# Patient Record
Sex: Male | Born: 1995 | Race: White | Hispanic: Yes | Marital: Single | Smoking: Former smoker
Health system: Southern US, Community
[De-identification: ages and names within clinical notes are randomized; demographics above are authoritative.]

## PROBLEM LIST (undated history)

## (undated) ENCOUNTER — Ambulatory Visit (HOSPITAL_COMMUNITY): Discharge status: Absent without leave | Payer: 59

## (undated) ENCOUNTER — Emergency Department (HOSPITAL_BASED_OUTPATIENT_CLINIC_OR_DEPARTMENT_OTHER): Admission: EM | Payer: No Typology Code available for payment source | Source: Home / Self Care

## (undated) DIAGNOSIS — J9600 Acute respiratory failure, unspecified whether with hypoxia or hypercapnia: Secondary | ICD-10-CM

## (undated) HISTORY — PX: OTHER SURGICAL HISTORY: SHX169

## (undated) HISTORY — PX: KNEE ARTHROSCOPY: SUR90

---
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
## 2016-05-13 ENCOUNTER — Encounter (HOSPITAL_COMMUNITY): Payer: Self-pay | Admitting: Family Medicine

## 2016-05-13 ENCOUNTER — Ambulatory Visit (HOSPITAL_COMMUNITY)
Admission: EM | Admit: 2016-05-13 | Discharge: 2016-05-13 | Disposition: A | Discharge status: Home / Self Care | Payer: BLUE CROSS/BLUE SHIELD | Attending: Family Medicine | Admitting: Family Medicine

## 2016-05-13 DIAGNOSIS — K2900 Acute gastritis without bleeding: Secondary | ICD-10-CM

## 2016-05-13 MED ORDER — ONDANSETRON 8 MG PO TBDP: 8.0000 mg | ORAL_TABLET | Freq: Three times a day (TID) | ORAL | 0 refills | Status: DC | PRN

## 2016-05-13 MED ORDER — OMEPRAZOLE 20 MG PO CPDR: 20.0000 mg | DELAYED_RELEASE_CAPSULE | Freq: Every day | ORAL | 1 refills | Status: DC

## 2016-05-13 NOTE — Discharge Instructions (Signed)
You need to realize that alcohol is a solvent and will cause acute gastrointestinal bleeding. I recommend that you stop drinking a call completely for at least 2 weeks. If you feel that you cannot stop drinking, that suggests a different problem and you would need to see a counselor to help you through that situation.

## 2016-05-13 NOTE — ED Provider Notes (Addendum)
[facility]    CSN: [id number] Arrival date & time: 05/13/16  1410     History   Chief Complaint Chief Complaint  Patient presents with  . Abdominal Pain  . Nausea    HPI Roberto Sims is a 20 y.o. male.   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 Vomiting is usually in the morning. He says that at times he's had quite a bit of alcohol to drink (Saturday night he had 10 beers). He says he does not smoke or take any other medicine. He has not tried anything to alleviate the problem of the past week.  Patient denies chronic abdominal pain or gastrointestinal problems. He's had no diarrhea.  Patient says that on a couple occasions with him these had a little red streaking.  I spoke to the patient after writing the prescriptions. He can find an intermediate that he had been through rehabilitation and that he knows he has an alcohol problem. I offered to come up with rehabilitation services and strongly recommended that he stop drinking altogether. He declined my suggestion for rehabilitation and said he appreciated my suggestions.      History reviewed. No pertinent past medical history.  There are no active problems to display for this patient.   Past Surgical History:  Procedure Laterality Date  . boxers fracture Right   . great toe fracture Right 2008   Fx. growth plate  . KNEE ARTHROSCOPY Left        Home Medications    Prior to Admission medications   Not on File    Family History History reviewed. No pertinent family history.  Social History Social History  Substance Use Topics  . Smoking status: Never Smoker  . Smokeless tobacco: Never Used  . Alcohol use Yes     Comment: 0ccasional     Allergies   Review of patient's allergies indicates no known allergies.   Review of Systems Review of Systems  Constitutional: Negative.   HENT: Negative.   Respiratory: Negative.     Cardiovascular: Negative.   Gastrointestinal: Positive for nausea and vomiting.     Physical Exam Triage Vital Signs ED Triage Vitals [05/13/16 1522]  Enc Vitals Group     BP 126/82     Pulse Rate 60     Resp 16     Temp 98.5 F (36.9 C)     Temp Source Oral     SpO2 100 %     Weight      Height      Head Circumference      Peak Flow      Pain Score      Pain Loc      Pain Edu?      Excl. in GC?    No data found.   Updated Vital Signs BP 126/82 (BP Location: Left Arm)   Pulse 60   Temp 98.5 F (36.9 C) (Oral)   Resp 16   SpO2 100%   Physical Exam  Constitutional: He is oriented to person, place, and time. He appears well-developed and well-nourished.  HENT:  Head: Normocephalic.  Right Ear: External ear normal.  Left Ear: External ear normal.  Mouth/Throat: Oropharynx is clear and moist.  Eyes: Conjunctivae and EOM are normal. Pupils are equal, round, and reactive to light.  Neck: Normal range of motion. Neck supple.  Cardiovascular: Normal rate, regular rhythm and normal heart sounds.   Pulmonary/Chest: Effort  normal and breath sounds normal.  Abdominal: Soft. Bowel sounds are normal. He exhibits no mass. There is tenderness. There is no rebound and no guarding.  Mild right upper quadrant tenderness with deep palpation  Musculoskeletal: Normal range of motion.  Neurological: He is alert and oriented to person, place, and time.  Skin: Skin is warm and dry.  Nursing note and vitals reviewed.    UC Treatments / Results  Labs (all labs ordered are listed, but only abnormal results are displayed) Labs Reviewed - No data to display  EKG  EKG Interpretation None       Radiology No results found.  Procedures Procedures (including critical care time)  Medications Ordered in UC Medications - No data to display   Initial Impression / Assessment and Plan / UC Course  I have reviewed the triage vital signs and the nursing notes.  Pertinent labs &  imaging results that were available during my care of the patient were reviewed by me and considered in my medical decision making (see chart for details).  Clinical Course    Final Clinical Impressions(s) / UC Diagnoses   Final diagnoses:  Acute superficial gastritis without hemorrhage    New Prescriptions Current Discharge Medication List       Elvina SidleKurt Quindon Denker, MD 05/13/16 1546    Elvina SidleKurt Tyshon Fanning, MD 05/13/16 [phone]

## 2016-05-13 NOTE — ED Triage Notes (Signed)
Pt here for intermittent vomiting in the am with traces of blood. Complaining of pain in the upper abdomen more on the right side and burning.

## 2016-07-22 ENCOUNTER — Ambulatory Visit (HOSPITAL_COMMUNITY)
Admission: EM | Admit: 2016-07-22 | Discharge: 2016-07-22 | Discharge status: Absent without leave | Payer: BLUE CROSS/BLUE SHIELD

## 2018-03-14 ENCOUNTER — Ambulatory Visit (HOSPITAL_COMMUNITY)
Admission: EM | Admit: 2018-03-14 | Discharge: 2018-03-14 | Disposition: A | Discharge status: Home / Self Care | Payer: 59 | Attending: Family Medicine | Admitting: Family Medicine

## 2018-03-14 ENCOUNTER — Other Ambulatory Visit: Payer: Self-pay

## 2018-03-14 ENCOUNTER — Encounter (HOSPITAL_COMMUNITY): Payer: Self-pay | Admitting: *Deleted

## 2018-03-14 DIAGNOSIS — F410 Panic disorder [episodic paroxysmal anxiety] without agoraphobia: Secondary | ICD-10-CM

## 2018-03-14 DIAGNOSIS — R002 Palpitations: Secondary | ICD-10-CM

## 2018-03-14 NOTE — Discharge Instructions (Signed)
Please follow-up with behavioral health to help manage your anxiety/panic attacks.  As discussed, other things that can cause increase in panic attacks symptoms include alcohol, caffeine, smoking, drug use.  Please try to decrease the amount of alcohol, caffeine, smoking you are doing.  Please refrain from drug use.  Follow-up with behavioral health for further evaluation and management needed.  If having suicidal or homicidal ideation, please go to the emergency department for further evaluation.

## 2018-03-14 NOTE — ED Triage Notes (Signed)
Per pt he keeps having panic attacks, per pt this is his 5th one

## 2018-03-14 NOTE — ED Provider Notes (Signed)
[facility]    CSN: [id number] Arrival [id number] & time: 03/14/18  1044     History   Chief Complaint Chief Complaint  Patient presents with  . panic attacks    HPI Roberto Sims is a 22 y.o. male.   22 year old male with history of anxiety comes in for 2-week history of panic attacks.  States he has had 5 episodes of panic attacks.  States he gets anxious prior to social settings, last panic attack prior to these past 2 weeks was 3 months ago.  He denies any new stressors in life, changes.  States episodes include hyperventilation, tunnel vision, nausea, diaphoresis, palpitation.  Usually all the symptoms last for about 30 minutes, and gradually resolves within the next hour.  Depending on severity of panic attacks, he does feel slight dizziness, lightheadedness.  Usually the symptoms start prior to work or prior to social settings, had an episode of panic attacks this morning after waking up.  He denies suicidal ideation, homicidal ideation.  Denies chest pain.  Denies similar symptoms during exertion/exercising.  He is a current every other day THC smoker.  Daily alcohol use, states drinks 1 beer every day, and about 3 times a week, drinks more depending on social setting.  He drinks an energy drink every other day before gym.  States he had consistent benzo use about 5 years ago, recently, friend gave him an Ativan during 1 of the panic attacks.  Has cocaine use 3 months ago.  No other drug use.  He is currently is asymptomatic, states due to symptoms, was not able to go into work.     History reviewed. No pertinent past medical history.  There are no active problems to display for this patient.   Past Surgical History:  Procedure Laterality Date  . boxers fracture Right   . great toe fracture Right 2008   Fx. growth plate  . KNEE ARTHROSCOPY Left        Home Medications    Prior to Admission medications   Not on File    Family History History reviewed. No  pertinent family history.  Social History Social History   Tobacco Use  . Smoking status: Never Smoker  . Smokeless tobacco: Never Used  Substance Use Topics  . Alcohol use: Yes    Comment: 0ccasional  . Drug use: Not on file     Allergies   Patient has no known allergies.   Review of Systems Review of Systems  Reason unable to perform ROS: See HPI as above.     Physical Exam Triage Vital Signs ED Triage Vitals  Enc Vitals Group     BP 03/14/18 1216 112/72     Pulse Rate 03/14/18 1216 65     Resp --      Temp 03/14/18 1216 (!) 97.2 F (36.2 C)     Temp Source 03/14/18 1216 Oral     SpO2 03/14/18 1216 100 %     Weight --      Height --      Head Circumference --      Peak Flow --      Pain Score 03/14/18 1217 0     Pain Loc --      Pain Edu? --      Excl. in GC? --    No data found.  Updated Vital Signs BP 112/72 (BP Location: Left Arm)   Pulse 65   Temp (!) 97.2 F (36.2 C) (Oral)  SpO2 100%   Physical Exam  Constitutional: He is oriented to person, place, and time. He appears well-developed and well-nourished. No distress.  HENT:  Head: Normocephalic and atraumatic.  Eyes: Pupils are equal, round, and reactive to light. Conjunctivae are normal.  Cardiovascular: Normal rate, regular rhythm and normal heart sounds. Exam reveals no gallop and no friction rub.  No murmur heard. Pulmonary/Chest: Effort normal and breath sounds normal. No accessory muscle usage or stridor. No respiratory distress. He has no decreased breath sounds. He has no wheezes. He has no rhonchi. He has no rales.  Neurological: He is alert and oriented to person, place, and time. He is not disoriented. Coordination and gait normal. GCS eye subscore is 4. GCS verbal subscore is 5. GCS motor subscore is 6.  Skin: He is not diaphoretic.  Psychiatric: He has a normal mood and affect. His speech is normal and behavior is normal. Judgment and thought content normal. He is not actively  hallucinating. Cognition and memory are normal. He expresses no homicidal and no suicidal ideation. He expresses no suicidal plans and no homicidal plans. He is attentive.     UC Treatments / Results  Labs (all labs ordered are listed, but only abnormal results are displayed) Labs Reviewed - No data to display  EKG None  Radiology No results found.  Procedures Procedures (including critical care time)  Medications Ordered in UC Medications - No data to display  Initial Impression / Assessment and Plan / UC Course  I have reviewed the triage vital signs and the nursing notes.  Pertinent labs & imaging results that were available during my care of the patient were reviewed by me and considered in my medical decision making (see chart for details).    Patient currently asymptomatic.  Denies suicidal or homicidal ideation.  Will have patient decrease caffeine intake, smoking, alcohol use, drug use.  Follow-up with behavioral health for further evaluation and management needed.  Return precautions given.  Resources provided.  Patient expresses understanding and agrees to plan.  Final Clinical Impressions(s) / UC Diagnoses   Final diagnoses:  Panic attacks  Palpitations    ED Prescriptions    None        Belinda Fisher, PA-C 03/14/18 1314

## 2018-04-15 ENCOUNTER — Ambulatory Visit (INDEPENDENT_AMBULATORY_CARE_PROVIDER_SITE_OTHER): Payer: 59

## 2018-04-15 ENCOUNTER — Ambulatory Visit (HOSPITAL_COMMUNITY)
Admission: EM | Admit: 2018-04-15 | Discharge: 2018-04-15 | Disposition: A | Discharge status: Home / Self Care | Payer: 59 | Attending: Family Medicine | Admitting: Family Medicine

## 2018-04-15 ENCOUNTER — Encounter (HOSPITAL_COMMUNITY): Payer: Self-pay

## 2018-04-15 DIAGNOSIS — J181 Lobar pneumonia, unspecified organism: Secondary | ICD-10-CM

## 2018-04-15 DIAGNOSIS — J189 Pneumonia, unspecified organism: Secondary | ICD-10-CM

## 2018-04-15 MED ORDER — ACETAMINOPHEN 325 MG PO TABS
650.0000 mg | ORAL_TABLET | Freq: Once | ORAL | Status: AC
  Administered 2018-04-15: 650 mg via ORAL

## 2018-04-15 MED ORDER — ALBUTEROL SULFATE HFA 108 (90 BASE) MCG/ACT IN AERS: 1.0000 | INHALATION_SPRAY | Freq: Four times a day (QID) | RESPIRATORY_TRACT | 0 refills | Status: DC | PRN

## 2018-04-15 MED ORDER — ACETAMINOPHEN 325 MG PO TABS
ORAL_TABLET | ORAL | Status: AC
  Filled 2018-04-15: qty 2

## 2018-04-15 MED ORDER — AZITHROMYCIN 250 MG PO TABS: ORAL_TABLET | ORAL | 0 refills | Status: AC

## 2018-04-15 NOTE — ED Triage Notes (Signed)
Pt presents with persistent cough, chills, headache, generalized body aches and fever.

## 2018-04-15 NOTE — Discharge Instructions (Signed)
Push fluids to ensure adequate hydration and keep secretions thin.  Tylenol and/or ibuprofen as needed for pain or fevers.  May continue with over the counter treatments as needed.  Complete course of antibiotics.  Use of inhaler as needed.  Please follow up with your primary care provider for recheck in 3-4 weeks.  If any worsening of symptoms, increased shortness of breath , difficulty breathing, fevers, not improving in the next week or otherwise worsening please return to be seen or go to Er.  Decrease to quit smoking as this can worsen symptoms.

## 2018-04-15 NOTE — ED Provider Notes (Signed)
[facility]    CSN: [id number] Arrival date & time: 04/15/18  1114     History   Chief Complaint Chief Complaint  Patient presents with  . Cough  . Chills  . Generalized Body Aches    HPI Roberto Sims is a 22 y.o. male.   Roberto Sims presents with complaints of fever, productive cough and nasal drainage. States developed cough approximately 1 week ago but had started to improved but then over the past two days has worsened. Cough is becoming less and less productive. Tylenol last at 0900 PTA. Has also been taking dayquil and nyquil which have not been helping. There has been cough illness at work. No asthma history. Smokes marijuana daily. Feels shortness of breath  At times. Nausea. No vomiting or diarrhea. Without contributing medical history.      ROS per HPI.      History reviewed. No pertinent past medical history.  There are no active problems to display for this patient.   Past Surgical History:  Procedure Laterality Date  . boxers fracture Right   . great toe fracture Right 2008   Fx. growth plate  . KNEE ARTHROSCOPY Left        Home Medications    Prior to Admission medications   Medication Sig Start Date End Date Taking? Authorizing Provider  albuterol (PROVENTIL HFA;VENTOLIN HFA) 108 (90 Base) MCG/ACT inhaler Inhale 1-2 puffs into the lungs every 6 (six) hours as needed for wheezing or shortness of breath. 04/15/18   Georgetta Haber, NP  azithromycin (ZITHROMAX) 250 MG tablet Take 2 tablets (500 mg total) by mouth daily for 1 day, THEN 1 tablet (250 mg total) daily for 4 days. 04/15/18 04/20/18  Georgetta Haber, NP    Family History History reviewed. No pertinent family history.  Social History Social History   Tobacco Use  . Smoking status: Never Smoker  . Smokeless tobacco: Never Used  Substance Use Topics  . Alcohol use: Yes    Comment: 0ccasional  . Drug use: Not on file     Allergies   Patient has no known  allergies.   Review of Systems Review of Systems   Physical Exam Triage Vital Signs ED Triage Vitals [04/15/18 1233]  Enc Vitals Group     BP 101/75     Pulse Rate (!) 110     Resp 20     Temp (!) 102.4 F (39.1 C)     Temp Source Oral     SpO2 93 %     Weight      Height      Head Circumference      Peak Flow      Pain Score      Pain Loc      Pain Edu?      Excl. in GC?    No data found.  Updated Vital Signs BP 101/75 (BP Location: Left Arm)   Pulse (!) 110   Temp (!) 102.4 F (39.1 C) (Oral)   Resp 20   SpO2 93%   Visual Acuity Right Eye Distance:   Left Eye Distance:   Bilateral Distance:    Right Eye Near:   Left Eye Near:    Bilateral Near:     Physical Exam  Constitutional: He is oriented to person, place, and time. He appears well-developed and well-nourished.  HENT:  Head: Normocephalic and atraumatic.  Right Ear: Tympanic membrane, external ear and ear canal normal.  Left Ear: Tympanic  membrane, external ear and ear canal normal.  Nose: Nose normal. Right sinus exhibits no maxillary sinus tenderness and no frontal sinus tenderness. Left sinus exhibits no maxillary sinus tenderness and no frontal sinus tenderness.  Mouth/Throat: Uvula is midline, oropharynx is clear and moist and mucous membranes are normal.  Eyes: Pupils are equal, round, and reactive to light. Conjunctivae are normal.  Neck: Normal range of motion.  Cardiovascular: Regular rhythm. Tachycardia present.  Pulmonary/Chest: Effort normal. He has decreased breath sounds in the right lower field and the left lower field.  Strong congested cough noted   Lymphadenopathy:    He has no cervical adenopathy.  Neurological: He is alert and oriented to person, place, and time.  Skin: Skin is warm. He is diaphoretic.  Vitals reviewed.    UC Treatments / Results  Labs (all labs ordered are listed, but only abnormal results are displayed) Labs Reviewed - No data to  display  EKG None  Radiology Dg Chest 2 View  Result Date: 04/15/2018 CLINICAL DATA:  Cough over the last week, worsening recently. Productive. Fever. EXAM: CHEST - 2 VIEW COMPARISON:  None. FINDINGS: Patchy bronchopneumonia in the right perihilar region and mid lung. No dense consolidation or lobar collapse. The remainder the chest appears clear. No effusions. Heart and mediastinal shadows are normal. IMPRESSION: Patchy bronchopneumonia in the right mid lung and perihilar region. Electronically Signed   By: Paulina Fusi M.D.   On: 04/15/2018 13:38    Procedures Procedures (including critical care time)  Medications Ordered in UC Medications  acetaminophen (TYLENOL) tablet 650 mg (650 mg Oral Given 04/15/18 1240)    Initial Impression / Assessment and Plan / UC Course  I have reviewed the triage vital signs and the nursing notes.  Pertinent labs & imaging results that were available during my care of the patient were reviewed by me and considered in my medical decision making (see chart for details).     Febrile with tachycardia. Cough. Chest xray, history and physical consistent with CAP. Azithromycin provided. Tylenol provided in clinic today. Inhaler prn. Return precautions provided. If symptoms worsen or do not improve in the next week to return to be seen or to follow up with PCP.  Patient verbalized understanding and agreeable to plan.   Final Clinical Impressions(s) / UC Diagnoses   Final diagnoses:  Community acquired pneumonia of right middle lobe of lung (HCC)     Discharge Instructions     Push fluids to ensure adequate hydration and keep secretions thin.  Tylenol and/or ibuprofen as needed for pain or fevers.  May continue with over the counter treatments as needed.  Complete course of antibiotics.  Use of inhaler as needed.  Please follow up with your primary care provider for recheck in 3-4 weeks.  If any worsening of symptoms, increased shortness of breath ,  difficulty breathing, fevers, not improving in the next week or otherwise worsening please return to be seen or go to Er.  Decrease to quit smoking as this can worsen symptoms.    ED Prescriptions    Medication Sig Dispense Auth. Provider   azithromycin (ZITHROMAX) 250 MG tablet Take 2 tablets (500 mg total) by mouth daily for 1 day, THEN 1 tablet (250 mg total) daily for 4 days. 6 tablet Linus Mako B, NP   albuterol (PROVENTIL HFA;VENTOLIN HFA) 108 (90 Base) MCG/ACT inhaler Inhale 1-2 puffs into the lungs every 6 (six) hours as needed for wheezing or shortness of breath. 1 Inhaler [place],  Barron Alvine, NP     Controlled Substance Prescriptions [place] Controlled Substance Registry consulted? Not Applicable   Georgetta Haber, NP 04/15/18 1351

## 2018-05-05 ENCOUNTER — Inpatient Hospital Stay (HOSPITAL_COMMUNITY)
Admission: EM | Admit: 2018-05-05 | Discharge: 2018-05-09 | DRG: 917 | Disposition: A | Discharge status: Home / Self Care | Payer: No Typology Code available for payment source | Attending: Internal Medicine | Admitting: Internal Medicine

## 2018-05-05 ENCOUNTER — Encounter (HOSPITAL_COMMUNITY): Payer: Self-pay | Admitting: *Deleted

## 2018-05-05 ENCOUNTER — Emergency Department (HOSPITAL_COMMUNITY): Payer: No Typology Code available for payment source

## 2018-05-05 DIAGNOSIS — T510X1A Toxic effect of ethanol, accidental (unintentional), initial encounter: Secondary | ICD-10-CM | POA: Diagnosis present

## 2018-05-05 DIAGNOSIS — Z23 Encounter for immunization: Secondary | ICD-10-CM

## 2018-05-05 DIAGNOSIS — E876 Hypokalemia: Secondary | ICD-10-CM | POA: Diagnosis present

## 2018-05-05 DIAGNOSIS — J9602 Acute respiratory failure with hypercapnia: Secondary | ICD-10-CM | POA: Diagnosis present

## 2018-05-05 DIAGNOSIS — E872 Acidosis: Secondary | ICD-10-CM | POA: Diagnosis present

## 2018-05-05 DIAGNOSIS — K7200 Acute and subacute hepatic failure without coma: Secondary | ICD-10-CM | POA: Diagnosis present

## 2018-05-05 DIAGNOSIS — G4700 Insomnia, unspecified: Secondary | ICD-10-CM | POA: Diagnosis present

## 2018-05-05 DIAGNOSIS — Y92003 Bedroom of unspecified non-institutional (private) residence as the place of occurrence of the external cause: Secondary | ICD-10-CM

## 2018-05-05 DIAGNOSIS — Y902 Blood alcohol level of 40-59 mg/100 ml: Secondary | ICD-10-CM | POA: Diagnosis present

## 2018-05-05 DIAGNOSIS — T424X2A Poisoning by benzodiazepines, intentional self-harm, initial encounter: Secondary | ICD-10-CM | POA: Diagnosis not present

## 2018-05-05 DIAGNOSIS — F329 Major depressive disorder, single episode, unspecified: Secondary | ICD-10-CM | POA: Diagnosis present

## 2018-05-05 DIAGNOSIS — F4010 Social phobia, unspecified: Secondary | ICD-10-CM | POA: Diagnosis present

## 2018-05-05 DIAGNOSIS — R4182 Altered mental status, unspecified: Secondary | ICD-10-CM | POA: Diagnosis present

## 2018-05-05 DIAGNOSIS — Z79899 Other long term (current) drug therapy: Secondary | ICD-10-CM | POA: Diagnosis not present

## 2018-05-05 DIAGNOSIS — N390 Urinary tract infection, site not specified: Secondary | ICD-10-CM | POA: Diagnosis not present

## 2018-05-05 DIAGNOSIS — J9600 Acute respiratory failure, unspecified whether with hypoxia or hypercapnia: Secondary | ICD-10-CM

## 2018-05-05 DIAGNOSIS — F410 Panic disorder [episodic paroxysmal anxiety] without agoraphobia: Secondary | ICD-10-CM | POA: Diagnosis present

## 2018-05-05 DIAGNOSIS — Z915 Personal history of self-harm: Secondary | ICD-10-CM | POA: Diagnosis not present

## 2018-05-05 DIAGNOSIS — T1491XA Suicide attempt, initial encounter: Secondary | ICD-10-CM | POA: Diagnosis not present

## 2018-05-05 DIAGNOSIS — R579 Shock, unspecified: Secondary | ICD-10-CM | POA: Diagnosis present

## 2018-05-05 DIAGNOSIS — F1994 Other psychoactive substance use, unspecified with psychoactive substance-induced mood disorder: Secondary | ICD-10-CM

## 2018-05-05 DIAGNOSIS — T405X1A Poisoning by cocaine, accidental (unintentional), initial encounter: Secondary | ICD-10-CM | POA: Diagnosis present

## 2018-05-05 DIAGNOSIS — T50901A Poisoning by unspecified drugs, medicaments and biological substances, accidental (unintentional), initial encounter: Secondary | ICD-10-CM | POA: Diagnosis present

## 2018-05-05 DIAGNOSIS — F1010 Alcohol abuse, uncomplicated: Secondary | ICD-10-CM | POA: Diagnosis not present

## 2018-05-05 DIAGNOSIS — J9690 Respiratory failure, unspecified, unspecified whether with hypoxia or hypercapnia: Secondary | ICD-10-CM | POA: Insufficient documentation

## 2018-05-05 DIAGNOSIS — F419 Anxiety disorder, unspecified: Secondary | ICD-10-CM | POA: Diagnosis present

## 2018-05-05 DIAGNOSIS — J690 Pneumonitis due to inhalation of food and vomit: Secondary | ICD-10-CM | POA: Diagnosis present

## 2018-05-05 DIAGNOSIS — Z113 Encounter for screening for infections with a predominantly sexual mode of transmission: Secondary | ICD-10-CM | POA: Diagnosis present

## 2018-05-05 DIAGNOSIS — N179 Acute kidney failure, unspecified: Secondary | ICD-10-CM | POA: Diagnosis present

## 2018-05-05 DIAGNOSIS — T50904S Poisoning by unspecified drugs, medicaments and biological substances, undetermined, sequela: Secondary | ICD-10-CM | POA: Diagnosis not present

## 2018-05-05 DIAGNOSIS — J9601 Acute respiratory failure with hypoxia: Secondary | ICD-10-CM | POA: Diagnosis present

## 2018-05-05 DIAGNOSIS — G9341 Metabolic encephalopathy: Secondary | ICD-10-CM | POA: Diagnosis present

## 2018-05-05 DIAGNOSIS — R7401 Elevation of levels of liver transaminase levels: Secondary | ICD-10-CM | POA: Diagnosis present

## 2018-05-05 DIAGNOSIS — T424X1A Poisoning by benzodiazepines, accidental (unintentional), initial encounter: Secondary | ICD-10-CM | POA: Diagnosis present

## 2018-05-05 DIAGNOSIS — T17908A Unspecified foreign body in respiratory tract, part unspecified causing other injury, initial encounter: Secondary | ICD-10-CM | POA: Diagnosis not present

## 2018-05-05 DIAGNOSIS — Z811 Family history of alcohol abuse and dependence: Secondary | ICD-10-CM

## 2018-05-05 DIAGNOSIS — R740 Nonspecific elevation of levels of transaminase and lactic acid dehydrogenase [LDH]: Secondary | ICD-10-CM

## 2018-05-05 DIAGNOSIS — T17908S Unspecified foreign body in respiratory tract, part unspecified causing other injury, sequela: Secondary | ICD-10-CM | POA: Diagnosis not present

## 2018-05-05 HISTORY — DX: Acute respiratory failure, unspecified whether with hypoxia or hypercapnia: J96.00

## 2018-05-05 LAB — URINALYSIS, ROUTINE W REFLEX MICROSCOPIC
BILIRUBIN URINE: NEGATIVE
Glucose, UA: 50 mg/dL — AB
KETONES UR: NEGATIVE mg/dL
LEUKOCYTES UA: NEGATIVE
Nitrite: NEGATIVE
PH: 6 (ref 5.0–8.0)
PROTEIN: 30 mg/dL — AB
Specific Gravity, Urine: 1.006 (ref 1.005–1.030)

## 2018-05-05 LAB — CBC WITH DIFFERENTIAL/PLATELET
ABS IMMATURE GRANULOCYTES: 0.7 10*3/uL — AB (ref 0.00–0.07)
BASOS PCT: 0 %
Basophils Absolute: 0.1 10*3/uL (ref 0.0–0.1)
EOS ABS: 0.2 10*3/uL (ref 0.0–0.5)
Eosinophils Relative: 1 %
HCT: 51.1 % (ref 39.0–52.0)
Hemoglobin: 15.8 g/dL (ref 13.0–17.0)
Immature Granulocytes: 3 %
Lymphocytes Relative: 16 %
Lymphs Abs: 3.3 10*3/uL (ref 0.7–4.0)
MCH: 29.3 pg (ref 26.0–34.0)
MCHC: 30.9 g/dL (ref 30.0–36.0)
MCV: 94.6 fL (ref 80.0–100.0)
MONOS PCT: 2 %
Monocytes Absolute: 0.4 10*3/uL (ref 0.1–1.0)
NEUTROS ABS: 16 10*3/uL — AB (ref 1.7–7.7)
Neutrophils Relative %: 78 %
PLATELETS: 332 10*3/uL (ref 150–400)
RBC: 5.4 MIL/uL (ref 4.22–5.81)
RDW: 12 % (ref 11.5–15.5)
WBC: 20.8 10*3/uL — AB (ref 4.0–10.5)
nRBC: 0 % (ref 0.0–0.2)

## 2018-05-05 LAB — BLOOD GAS, ARTERIAL
Acid-base deficit: 4.7 mmol/L — ABNORMAL HIGH (ref 0.0–2.0)
BICARBONATE: 20.6 mmol/L (ref 20.0–28.0)
Drawn by: 244861
FIO2: 40
O2 SAT: 93.1 %
PEEP: 5 cmH2O
Patient temperature: 98.6
RATE: 14 resp/min
VT: 630 mL
pCO2 arterial: 42.5 mmHg (ref 32.0–48.0)
pH, Arterial: 7.306 — ABNORMAL LOW (ref 7.350–7.450)
pO2, Arterial: 71.9 mmHg — ABNORMAL LOW (ref 83.0–108.0)

## 2018-05-05 LAB — COMPREHENSIVE METABOLIC PANEL
ALBUMIN: 4.3 g/dL (ref 3.5–5.0)
ALK PHOS: 90 U/L (ref 38–126)
ALT: 1012 U/L — ABNORMAL HIGH (ref 0–44)
ALT: 3387 U/L — AB (ref 0–44)
ANION GAP: 19 — AB (ref 5–15)
AST: 541 U/L — ABNORMAL HIGH (ref 15–41)
AST: 2401 U/L — AB (ref 15–41)
Albumin: 3.6 g/dL (ref 3.5–5.0)
Alkaline Phosphatase: 71 U/L (ref 38–126)
Anion gap: 9 (ref 5–15)
BILIRUBIN TOTAL: 1.2 mg/dL (ref 0.3–1.2)
BUN: 12 mg/dL (ref 6–20)
BUN: 12 mg/dL (ref 6–20)
CALCIUM: 8.8 mg/dL — AB (ref 8.9–10.3)
CHLORIDE: 110 mmol/L (ref 98–111)
CO2: 17 mmol/L — ABNORMAL LOW (ref 22–32)
CO2: 22 mmol/L (ref 22–32)
CREATININE: 1.44 mg/dL — AB (ref 0.61–1.24)
Calcium: 7.8 mg/dL — ABNORMAL LOW (ref 8.9–10.3)
Chloride: 104 mmol/L (ref 98–111)
Creatinine, Ser: 2.12 mg/dL — ABNORMAL HIGH (ref 0.61–1.24)
GFR calc Af Amer: 49 mL/min — ABNORMAL LOW (ref >60)
GFR calc Af Amer: >60 mL/min (ref >60)
GFR calc non Af Amer: >60 mL/min (ref >60)
GFR, EST NON AFRICAN AMERICAN: 42 mL/min — AB (ref >60)
GLUCOSE: 173 mg/dL — AB (ref 70–99)
Glucose, Bld: 111 mg/dL — ABNORMAL HIGH (ref 70–99)
Potassium: 4.3 mmol/L (ref 3.5–5.1)
Potassium: 3.8 mmol/L (ref 3.5–5.1)
SODIUM: 141 mmol/L (ref 135–145)
Sodium: 140 mmol/L (ref 135–145)
TOTAL PROTEIN: 7.6 g/dL (ref 6.5–8.1)
TOTAL PROTEIN: 6.3 g/dL — AB (ref 6.5–8.1)
Total Bilirubin: 1.3 mg/dL — ABNORMAL HIGH (ref 0.3–1.2)

## 2018-05-05 LAB — I-STAT CG4 LACTIC ACID, ED: Lactic Acid, Venous: 8.98 mmol/L (ref 0.5–1.9)

## 2018-05-05 LAB — CBC
HEMATOCRIT: 44.1 % (ref 39.0–52.0)
HEMOGLOBIN: 14.6 g/dL (ref 13.0–17.0)
MCH: 29.9 pg (ref 26.0–34.0)
MCHC: 33.1 g/dL (ref 30.0–36.0)
MCV: 90.2 fL (ref 80.0–100.0)
Platelets: 252 10*3/uL (ref 150–400)
RBC: 4.89 MIL/uL (ref 4.22–5.81)
RDW: 12 % (ref 11.5–15.5)
WBC: 9.8 10*3/uL (ref 4.0–10.5)
nRBC: 0 % (ref 0.0–0.2)

## 2018-05-05 LAB — PROTIME-INR
INR: 1.18
INR: 1.28
PROTHROMBIN TIME: 14.9 s (ref 11.4–15.2)
PROTHROMBIN TIME: 15.9 s — AB (ref 11.4–15.2)

## 2018-05-05 LAB — I-STAT CHEM 8, ED
BUN: 17 mg/dL (ref 6–20)
CHLORIDE: 104 mmol/L (ref 98–111)
CREATININE: 2.3 mg/dL — AB (ref 0.61–1.24)
Calcium, Ion: 1.04 mmol/L — ABNORMAL LOW (ref 1.15–1.40)
Glucose, Bld: 172 mg/dL — ABNORMAL HIGH (ref 70–99)
HEMATOCRIT: 49 % (ref 39.0–52.0)
HEMOGLOBIN: 16.7 g/dL (ref 13.0–17.0)
POTASSIUM: 4.3 mmol/L (ref 3.5–5.1)
Sodium: 141 mmol/L (ref 135–145)
TCO2: 23 mmol/L (ref 22–32)

## 2018-05-05 LAB — CORTISOL: CORTISOL PLASMA: 13.3 ug/dL

## 2018-05-05 LAB — RAPID URINE DRUG SCREEN, HOSP PERFORMED
Amphetamines: NOT DETECTED
Amphetamines: NOT DETECTED
BARBITURATES: NOT DETECTED
BENZODIAZEPINES: POSITIVE — AB
Barbiturates: NOT DETECTED
Benzodiazepines: POSITIVE — AB
COCAINE: POSITIVE — AB
Cocaine: POSITIVE — AB
OPIATES: NOT DETECTED
OPIATES: NOT DETECTED
Tetrahydrocannabinol: POSITIVE — AB
Tetrahydrocannabinol: POSITIVE — AB

## 2018-05-05 LAB — TYPE AND SCREEN
ABO/RH(D): A POS
Antibody Screen: NEGATIVE

## 2018-05-05 LAB — PROCALCITONIN: Procalcitonin: 1.61 ng/mL

## 2018-05-05 LAB — I-STAT TROPONIN, ED: TROPONIN I, POC: 0.05 ng/mL (ref 0.00–0.08)

## 2018-05-05 LAB — I-STAT ARTERIAL BLOOD GAS, ED
Acid-base deficit: 7 mmol/L — ABNORMAL HIGH (ref 0.0–2.0)
Bicarbonate: 20.8 mmol/L (ref 20.0–28.0)
O2 Saturation: 98 %
PH ART: 7.268 — AB (ref 7.350–7.450)
TCO2: 22 mmol/L (ref 22–32)
pCO2 arterial: 44.7 mmHg (ref 32.0–48.0)
pO2, Arterial: 124 mmHg — ABNORMAL HIGH (ref 83.0–108.0)

## 2018-05-05 LAB — STREP PNEUMONIAE URINARY ANTIGEN: Strep Pneumo Urinary Antigen: NEGATIVE

## 2018-05-05 LAB — APTT: aPTT: 36 seconds (ref 24–36)

## 2018-05-05 LAB — LIPASE, BLOOD: LIPASE: 29 U/L (ref 11–51)

## 2018-05-05 LAB — GLUCOSE, CAPILLARY
GLUCOSE-CAPILLARY: 58 mg/dL — AB (ref 70–99)
GLUCOSE-CAPILLARY: 58 mg/dL — AB (ref 70–99)
GLUCOSE-CAPILLARY: 127 mg/dL — AB (ref 70–99)
Glucose-Capillary: 78 mg/dL (ref 70–99)

## 2018-05-05 LAB — PHOSPHORUS: Phosphorus: 3.7 mg/dL (ref 2.5–4.6)

## 2018-05-05 LAB — AMYLASE: Amylase: 1008 U/L — ABNORMAL HIGH (ref 28–100)

## 2018-05-05 LAB — TRIGLYCERIDES: Triglycerides: 201 mg/dL — ABNORMAL HIGH (ref <150)

## 2018-05-05 LAB — MAGNESIUM: MAGNESIUM: 2 mg/dL (ref 1.7–2.4)

## 2018-05-05 LAB — MRSA PCR SCREENING: MRSA by PCR: NEGATIVE

## 2018-05-05 LAB — CBG MONITORING, ED: Glucose-Capillary: 173 mg/dL — ABNORMAL HIGH (ref 70–99)

## 2018-05-05 LAB — ETHANOL: Alcohol, Ethyl (B): 50 mg/dL — ABNORMAL HIGH (ref <10)

## 2018-05-05 LAB — LACTIC ACID, PLASMA: LACTIC ACID, VENOUS: 2.7 mmol/L — AB (ref 0.5–1.9)

## 2018-05-05 LAB — CK
CK TOTAL: 153 U/L (ref 49–397)
CK TOTAL: 179 U/L (ref 49–397)

## 2018-05-05 MED ORDER — HEPARIN SODIUM (PORCINE) 5000 UNIT/ML IJ SOLN
5000.0000 [IU] | Freq: Three times a day (TID) | INTRAMUSCULAR | Status: DC
  Administered 2018-05-05 – 2018-05-08 (×9): 5000 [IU] via SUBCUTANEOUS
  Filled 2018-05-05 (×9): qty 1

## 2018-05-05 MED ORDER — SODIUM CHLORIDE 0.9 % IV SOLN
INTRAVENOUS | Status: DC
  Administered 2018-05-05 – 2018-05-07 (×3): via INTRAVENOUS

## 2018-05-05 MED ORDER — DEXTROSE 50 % IV SOLN
25.0000 mL | Freq: Once | INTRAVENOUS | Status: AC
  Administered 2018-05-05: 25 mL via INTRAVENOUS

## 2018-05-05 MED ORDER — SODIUM CHLORIDE 0.9 % IV BOLUS
1000.0000 mL | Freq: Once | INTRAVENOUS | Status: AC
  Administered 2018-05-05: 1000 mL via INTRAVENOUS

## 2018-05-05 MED ORDER — FAMOTIDINE IN NACL 20-0.9 MG/50ML-% IV SOLN: 20.0000 mg | Freq: Two times a day (BID) | INTRAVENOUS | Status: DC

## 2018-05-05 MED ORDER — PROPOFOL 1000 MG/100ML IV EMUL
INTRAVENOUS | Status: AC
  Filled 2018-05-05: qty 100

## 2018-05-05 MED ORDER — SODIUM CHLORIDE 0.9 % IV SOLN
1.5000 g | Freq: Four times a day (QID) | INTRAVENOUS | Status: DC
  Administered 2018-05-05 – 2018-05-07 (×7): 1.5 g via INTRAVENOUS
  Filled 2018-05-05 (×9): qty 1.5

## 2018-05-05 MED ORDER — PROPOFOL 1000 MG/100ML IV EMUL
5.0000 ug/kg/min | Freq: Once | INTRAVENOUS | Status: AC
  Administered 2018-05-05: 30 ug/kg/min via INTRAVENOUS

## 2018-05-05 MED ORDER — ALBUTEROL SULFATE (2.5 MG/3ML) 0.083% IN NEBU: 2.5000 mg | INHALATION_SOLUTION | RESPIRATORY_TRACT | Status: DC | PRN

## 2018-05-05 MED ORDER — MORPHINE SULFATE (PF) 4 MG/ML IV SOLN: 4.0000 mg | Freq: Once | INTRAVENOUS | Status: DC

## 2018-05-05 MED ORDER — LORAZEPAM 2 MG/ML IJ SOLN: 2.0000 mg | Freq: Once | INTRAMUSCULAR | Status: DC

## 2018-05-05 MED ORDER — ETOMIDATE 2 MG/ML IV SOLN
INTRAVENOUS | Status: AC | PRN
  Administered 2018-05-05: 20 mg via INTRAVENOUS

## 2018-05-05 MED ORDER — SODIUM CHLORIDE 0.9 % IV SOLN: 250.0000 mL | INTRAVENOUS | Status: DC

## 2018-05-05 MED ORDER — PHENYLEPHRINE HCL-NACL 10-0.9 MG/250ML-% IV SOLN
25.0000 ug/min | INTRAVENOUS | Status: DC
  Filled 2018-05-05: qty 250

## 2018-05-05 MED ORDER — PROPOFOL 1000 MG/100ML IV EMUL
5.0000 ug/kg/min | INTRAVENOUS | Status: DC
  Administered 2018-05-05: 45 ug/kg/min via INTRAVENOUS
  Administered 2018-05-05: 30 ug/kg/min via INTRAVENOUS
  Administered 2018-05-05 – 2018-05-06 (×3): 70 ug/kg/min via INTRAVENOUS
  Filled 2018-05-05 (×6): qty 100

## 2018-05-05 MED ORDER — LORAZEPAM 2 MG/ML IJ SOLN
INTRAMUSCULAR | Status: AC
  Administered 2018-05-05: 2 mg
  Filled 2018-05-05: qty 1

## 2018-05-05 MED ORDER — NALOXONE HCL 2 MG/2ML IJ SOSY
2.0000 mg | PREFILLED_SYRINGE | Freq: Once | INTRAMUSCULAR | Status: AC
  Administered 2018-05-05: 2 mg via INTRAVENOUS

## 2018-05-05 MED ORDER — SODIUM CHLORIDE 0.9 % IV SOLN
600.0000 mg | Freq: Once | INTRAVENOUS | Status: AC
  Administered 2018-05-05: 600 mg via INTRAVENOUS
  Filled 2018-05-05: qty 6

## 2018-05-05 MED ORDER — FENTANYL CITRATE (PF) 100 MCG/2ML IJ SOLN
25.0000 ug | INTRAMUSCULAR | Status: DC | PRN
  Administered 2018-05-05: 100 ug via INTRAVENOUS
  Filled 2018-05-05: qty 2

## 2018-05-05 MED ORDER — FAMOTIDINE IN NACL 20-0.9 MG/50ML-% IV SOLN
20.0000 mg | Freq: Two times a day (BID) | INTRAVENOUS | Status: DC
  Administered 2018-05-05 – 2018-05-08 (×6): 20 mg via INTRAVENOUS
  Filled 2018-05-05 (×6): qty 50

## 2018-05-05 MED ORDER — LORAZEPAM 2 MG/ML IJ SOLN: 0.5000 mg | INTRAMUSCULAR | Status: DC | PRN

## 2018-05-05 MED ORDER — SUCCINYLCHOLINE CHLORIDE 20 MG/ML IJ SOLN
INTRAMUSCULAR | Status: AC | PRN
  Administered 2018-05-05: 120 mg via INTRAVENOUS

## 2018-05-05 MED ORDER — DEXTROSE 50 % IV SOLN
INTRAVENOUS | Status: AC
  Filled 2018-05-05: qty 50

## 2018-05-05 NOTE — ED Notes (Signed)
Positive I-stat Lactic acid results reported to Rosendo Gros, RN and Rodena Medin, MD

## 2018-05-05 NOTE — Progress Notes (Signed)
Pharmacy Antibiotic Note  Roberto Sims is a 22 y.o. male admitted on 05/05/2018 with aspiration pneumonia.  Pharmacy has been consulted for Unasyn dosing.  Plan: Unasyn 1.5 gm IV every 6 hours Will monitor Scr and adjust if needed  Height: 6' (182.9 cm) Weight: 197 lb 5 oz (89.5 kg) IBW/kg (Calculated) : 77.6  Temp (24hrs), Avg:96.5 F (35.8 C), Min:96.1 F (35.6 C), Max:98.8 F (37.1 C)  Recent Labs  Lab 05/05/18 0914 05/05/18 0931 05/05/18 0932 05/05/18 1331  WBC 20.8*  --   --  9.8  CREATININE 2.12* 2.30*  --  1.44*  LATICACIDVEN  --   --  8.98* 2.7*    Estimated Creatinine Clearance: 87.6 mL/min (A) (by C-G formula based on SCr of 1.44 mg/dL (H)).    No Known Allergies  Antimicrobials this admission: Unasyn 10/22 >>    Thank you for allowing pharmacy to be a part of this patient's care.  Jeanella Cara, PharmD, [facility] Clinical Pharmacist Please see AMION for all Pharmacists' Contact Phone Numbers 05/05/2018, 5:42 PM

## 2018-05-05 NOTE — H&P (Signed)
PULMONARY / CRITICAL CARE MEDICINE   NAME:  Roberto Sims, MRN:  [id number], DOB:  Nov 28, 1995, LOS: 0 ADMISSION DATE:  05/05/2018, REFERRING MD: Emergency department physician, CHIEF COMPLAINT: Vent dependent respiratory failure secondary to presumed substance abuse  BRIEF HISTORY:    22 year old with a history of substance abuse comes in with altered mental status. HISTORY OF PRESENT ILLNESS   22 year old male with a known history of panic attacks and anxiety and substance abuse who was last seen normal 05/03/2018 approximately 3:30 AM.  Stent to be obtunded at 7:00 required mechanical ventilatory support IE noninvasive bag mask valve transported to the emergency department. He was intubated by emergency department physician taken to the CT scan and treated with Ativan and propofol.  Pulmonary critical care was called to bedside and asked to admit. SIGNIFICANT PAST MEDICAL HISTORY   Anxiety Panic attacks Substance abuse  SIGNIFICANT EVENTS:  05/05/2018 admitted with altered mental status STUDIES:   05/03/2018 CT head with no acute abnormality CULTURES:    ANTIBIOTICS:    LINES/TUBES:   05/05/2018 endotracheal tube>> CONSULTANTS:   SUBJECTIVE:  22 year old with a history of anxiety and panic attacks and substance abuse admitted to due to being obtunded.  CONSTITUTIONAL: BP 97/62   Pulse 88   Temp (!) 96.1 F (35.6 C)   Resp 14   Ht 6' (1.829 m)   Wt 71 kg Comment: per RN  SpO2 100%   BMI 21.23 kg/m   No intake/output data recorded.     Vent Mode: PRVC FiO2 (%):  [60 %] 60 % Set Rate:  [14 bmp] 14 bmp Vt Set:  [630 mL] 630 mL PEEP:  [5 cmH20] 5 cmH20 Plateau Pressure:  [22 cmH20] 22 cmH20  PHYSICAL EXAM: General: Well-nourished well-developed male sedated on ventilator Neuro: Currently sedated with propofol.  Withdraws all extremities to noxious stimuli HEENT: Pupils equal reactive to light. Cardiovascular: Heart sounds regular regular rate and  rhythm Lungs: Clear to auscultation Abdomen: Soft nontender Musculoskeletal: Intact Skin: Warm and dry blood sugar  RESOLVED PROBLEM LIST   ASSESSMENT AND PLAN   Encephalopathic likely secondary to substance abuse possibility of alcohol withdrawal seizures. Full mechanical ventilatory support Drug screen CT of the head was negative Wean sedation as able  Vent dependent respiratory failure secondary to suspected drug overdose Wean FiO2 as tolerated Sedation to tolerate tube Monitor arterial blood gases With metabolic acidosis corrected assess for extubation Chest x-ray unremarkable  History of substance abuse History of panic attack Drug screen Substance abuse counseling once   Metabolic acidosis Fluid resuscitation Monitor lactic acid Check CK  Hypotension ,leukocytosis Check procalcitonin Fluid resuscitation Monitor lactic acid Panculture for completeness   SUMMARY OF TODAY'S PLAN:  22 year old with history of anxiety presents with altered mental status last seen okay at approximately 3:30 AM 05/03/2018.  Negative CT of the head.  Noted to have a metabolic acidosis with CK CK-MBs are pending.  Currently intubated and sedated but does withdrawal to all noxious stimuli.  Best Practice / Goals of Care / Disposition.   DVT PROPHYLAXIS: Heparin SUP PPI NUTRITION: N.p.o. MOBILITY: Bedrest GOALS OF CARE: Full code FAMILY DISCUSSIONS: 05/05/2018 no family at bedside DISPOSITION admit the ICU most likely be extubated within 24 hours.  LABS  Glucose Recent Labs  Lab 05/05/18 0913  GLUCAP 173*    BMET Recent Labs  Lab 05/05/18 0914 05/05/18 0931  NA 140 141  K 4.3 4.3  CL 104 104  CO2 17*  --   BUN 12  17  CREATININE 2.12* 2.30*  GLUCOSE 173* 172*    Liver Enzymes Recent Labs  Lab 05/05/18 0914  AST 541*  ALT 1,012*  ALKPHOS 90  BILITOT 1.2  ALBUMIN 4.3    Electrolytes Recent Labs  Lab 05/05/18 0914  CALCIUM 8.8*    CBC Recent Labs   Lab 05/05/18 0914 05/05/18 0931  WBC 20.8*  --   HGB 15.8 16.7  HCT 51.1 49.0  PLT 332  --     ABG Recent Labs  Lab 05/05/18 1044  PHART 7.268*  PCO2ART 44.7  PO2ART 124.0*    Coag's Recent Labs  Lab 05/05/18 0914  INR 1.18    Sepsis Markers Recent Labs  Lab 05/05/18 0932  LATICACIDVEN 8.98*    Cardiac Enzymes No results for input(s): TROPONINI, PROBNP in the last 168 hours.  PAST MEDICAL HISTORY :   He  has no past medical history on file.  PAST SURGICAL HISTORY:  He  has a past surgical history that includes great toe fracture (Right, 2008); boxers fracture (Right); and Knee arthroscopy (Left).  No Known Allergies  No current facility-administered medications on file prior to encounter.    Current Outpatient Medications on File Prior to Encounter  Medication Sig  . albuterol (PROVENTIL HFA;VENTOLIN HFA) 108 (90 Base) MCG/ACT inhaler Inhale 1-2 puffs into the lungs every 6 (six) hours as needed for wheezing or shortness of breath.    FAMILY HISTORY:   His family history is not on file.  SOCIAL HISTORY:  He  reports that he has never smoked. He has never used smokeless tobacco. He reports that he drinks alcohol.  REVIEW OF SYSTEMS:    05/05/2018 not currently available secondary to being attempted

## 2018-05-05 NOTE — ED Notes (Addendum)
Pt arrives to trauma bay with [facility] for unresponsiveness.  Pt was last seen by friends at 0700, friends report "partying" last night. Pt was given 2 mg narcan with ems and 2 mg on arrival to trauma bay. Pt has OPA in placed and receiving 02 by BVM - 15L. Pt is minimally responsive.

## 2018-05-05 NOTE — ED Provider Notes (Signed)
[facility] EMERGENCY DEPARTMENT Provider Note   CSN: [id number] Arrival date & time: 05/05/18  4098     History   Chief Complaint Chief Complaint  Patient presents with  . unresponsive    HPI Roberto Sims is a 22 y.o. male.  22 year old male presents via EMS for altered mental status.  Patient was found unresponsive in his bed by his girlfriend.  Patient reportedly was normal sometime early this morning around 3:57 AM.  He reportedly had alcohol last night.  Patient without history of seizure.  Patient without history of recent drug abuse.  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  EMS reports that on their arrival on scene the patient had minimal ventilations.  He was unresponsive.  Patient has oral and nasal airways in place.  He is being bagged by EMS.  He has had 2 mg of Narcan IV push enroute without improvement in his mental status.  The history is provided by medical records and the EMS personnel. The history is limited by the condition of the patient.  Altered Mental Status   This is a new problem. The current episode started 3 to 5 hours ago. The problem has not changed since onset.Risk factors include alcohol intake. His past medical history does not include seizures.    No past medical history on file.  There are no active problems to display for this patient.   Past Surgical History:  Procedure Laterality Date  . boxers fracture Right   . great toe fracture Right 2008   Fx. growth plate  . KNEE ARTHROSCOPY Left         Home Medications    Prior to Admission medications   Medication Sig Start Date End Date Taking? Authorizing Provider  albuterol (PROVENTIL HFA;VENTOLIN HFA) 108 (90 Base) MCG/ACT inhaler Inhale 1-2 puffs into the lungs every 6 (six) hours as needed for wheezing or shortness of breath. 04/15/18   Georgetta Haber, NP    Family History No family  history on file.  Social History Social History   Tobacco Use  . Smoking status: Never Smoker  . Smokeless tobacco: Never Used  Substance Use Topics  . Alcohol use: Yes    Comment: 0ccasional  . Drug use: Not on file     Allergies   Patient has no known allergies.   Review of Systems Review of Systems  Unable to perform ROS: Acuity of condition     Physical Exam Updated Vital Signs BP 105/76   Pulse 96   Resp 17   Ht 6' (1.829 m)   Wt 71 kg Comment: per RN  SpO2 100%   BMI 21.23 kg/m   Physical Exam  Constitutional: He appears well-developed and well-nourished. No distress.  HENT:  Head: Normocephalic and atraumatic.  Mouth/Throat: Oropharynx is clear and moist.  Eyes: Pupils are equal, round, and reactive to light. Conjunctivae and EOM are normal.  Neck: Normal range of motion. Neck supple.  Cardiovascular: Normal rate, regular rhythm and normal heart sounds.  Pulmonary/Chest: Effort normal and breath sounds normal. No respiratory distress.  Abdominal: Soft. He exhibits no distension. There is no tenderness.  Musculoskeletal: Normal range of motion. He exhibits no edema or deformity.  Neurological:  Unresponsive  Ventilated by BVM Withdraws all 4 ext to painful stimulation  Pupils equal bilaterally but sluggish  Skin: Skin is warm and dry.  Psychiatric: He has a normal mood  and affect.  Nursing note and vitals reviewed.    ED Treatments / Results  Labs (all labs ordered are listed, but only abnormal results are displayed) Labs Reviewed  ETHANOL - Abnormal; Notable for the following components:      Result Value   Alcohol, Ethyl (B) 50 (*)    All other components within normal limits  COMPREHENSIVE METABOLIC PANEL - Abnormal; Notable for the following components:   CO2 17 (*)    Glucose, Bld 173 (*)    Creatinine, Ser 2.12 (*)    Calcium 8.8 (*)    AST 541 (*)    ALT 1,012 (*)    GFR calc non Af Amer 42 (*)    GFR calc Af Amer 49 (*)     Anion gap 19 (*)    All other components within normal limits  CBC WITH DIFFERENTIAL/PLATELET - Abnormal; Notable for the following components:   WBC 20.8 (*)    Neutro Abs 16.0 (*)    Abs Immature Granulocytes 0.70 (*)    All other components within normal limits  CBG MONITORING, ED - Abnormal; Notable for the following components:   Glucose-Capillary 173 (*)    All other components within normal limits  I-STAT CHEM 8, ED - Abnormal; Notable for the following components:   Creatinine, Ser 2.30 (*)    Glucose, Bld 172 (*)    Calcium, Ion 1.04 (*)    All other components within normal limits  I-STAT CG4 LACTIC ACID, ED - Abnormal; Notable for the following components:   Lactic Acid, Venous 8.98 (*)    All other components within normal limits  PROTIME-INR  URINALYSIS, ROUTINE W REFLEX MICROSCOPIC  RAPID URINE DRUG SCREEN, HOSP PERFORMED  BLOOD GAS, ARTERIAL  CK  I-STAT TROPONIN, ED  TYPE AND SCREEN    EKG EKG Interpretation  Date/Time:  Tuesday May 05 2018 09:09:55 EDT Ventricular Rate:  108 PR Interval:    QRS Duration: 114 QT Interval:  375 QTC Calculation: 503 R Axis:   99 Text Interpretation:  Sinus tachycardia Borderline intraventricular conduction delay RSR' in V1 or V2, probably normal variant Prolonged QT interval Confirmed by Kristine Royal [phone]) on 05/05/2018 9:24:21 AM   Radiology Dg Chest 1 View  Result Date: 05/05/2018 CLINICAL DATA:  Intubation. EXAM: CHEST  1 VIEW COMPARISON:  04/15/2018 FINDINGS: Endotracheal tube tip at the clavicular heads. An orogastric tube and side-port reaches the stomach. Low volume chest with streaky left perihilar density. Likely normal heart size when accounting for accentuation by low volumes. IMPRESSION: 1. Unremarkable hardware positioning. 2. Low volume chest with left perihilar atelectasis. Electronically Signed   By: Marnee Spring M.D.   On: 05/05/2018 09:58    Procedures Procedure Name: Intubation Date/Time:  05/05/2018 10:50 AM Performed by: Wynetta Fines, MD Pre-anesthesia Checklist: Patient identified, Patient being monitored, Emergency Drugs available, Timeout performed and Suction available Oxygen Delivery Method: Ambu bag Preoxygenation: Pre-oxygenation with 100% oxygen Induction Type: Rapid sequence Ventilation: Mask ventilation without difficulty Laryngoscope Size: Glidescope Grade View: Grade I Tube size: 7.5 mm Number of attempts: 1 Placement Confirmation: ETT inserted through vocal cords under direct vision,  CO2 detector,  Breath sounds checked- equal and bilateral and Positive ETCO2 Tube secured with: ETT holder Comments: Well tolerated by patient.       (including critical care time) CRITICAL CARE Performed by: Wynetta Fines   Total critical care time: 45 minutes  Critical care time was exclusive of separately billable procedures and treating  other patients.  Critical care was necessary to treat or prevent imminent or life-threatening deterioration.  Critical care was time spent personally by me on the following activities: development of treatment plan with patient and/or surrogate as well as nursing, discussions with consultants, evaluation of patient's response to treatment, examination of patient, obtaining history from patient or surrogate, ordering and performing treatments and interventions, ordering and review of laboratory studies, ordering and review of radiographic studies, pulse oximetry and re-evaluation of patient's condition.   Medications Ordered in ED Medications  morphine 4 MG/ML injection 4 mg (0 mg Intravenous Hold 05/05/18 1019)  LORazepam (ATIVAN) injection 2 mg (0 mg Intravenous Hold 05/05/18 1023)  etomidate (AMIDATE) injection (20 mg Intravenous Given 05/05/18 0917)  succinylcholine (ANECTINE) injection (120 mg Intravenous Given 05/05/18 0917)  propofol (DIPRIVAN) 1000 MG/100ML infusion (60 mcg/kg/min  71 kg Intravenous Rate/Dose Change  05/05/18 1024)  naloxone [facility]) injection 2 mg (2 mg Intravenous Given 05/05/18 0915)  sodium chloride 0.9 % bolus 1,000 mL (0 mLs Intravenous Stopped 05/05/18 1024)  LORazepam (ATIVAN) 2 MG/ML injection (2 mg  Given 05/05/18 1026)     Initial Impression / Assessment and Plan / ED Course  I have reviewed the triage vital signs and the nursing notes.  Pertinent labs & imaging results that were available during my care of the patient were reviewed by me and considered in my medical decision making (see chart for details).     MDM  Screen complete  Patient is presenting for altered mental status.  Upon arrival patient is with significant respiratory depression.  He is unable to protect his airway.  He was intubated upon arrival.  History suggest that the patient's presentation may be secondary to either drug overdose or perhaps withdrawal seizure.  Critical care services are aware of the case and will evaluate the patient for admission.  Final Clinical Impressions(s) / ED Diagnoses   Final diagnoses:  Altered mental status, unspecified altered mental status type    ED Discharge Orders    None       Wynetta Fines, MD 05/05/18 1051

## 2018-05-05 NOTE — ED Notes (Signed)
Family at bedside. 

## 2018-05-05 NOTE — Progress Notes (Signed)
Assisted with intubation and placed pateint on vent. No noted respiratory issues at this time.

## 2018-05-05 NOTE — Progress Notes (Signed)
   05/05/18 1000  Clinical Encounter Type  Visited With Patient;Family  Visit Type Initial  Referral From Nurse  Spiritual Encounters  Spiritual Needs Emotional  Stress Factors  Patient Stress Factors None identified  Family Stress Factors Other (Comment)   Responded to page to ED Trauma C. PT was unresponsive. Family was arriving in consult room. Doctor shared with family the status of PT. I offered Spiritual care with ministry of presence and words of encouragement. Chaplain available as needed.  Chaplain Orest Dikes  [phone]

## 2018-05-05 NOTE — Progress Notes (Signed)
[facility] called to notified of temp and urine color

## 2018-05-06 ENCOUNTER — Inpatient Hospital Stay (HOSPITAL_COMMUNITY): Payer: No Typology Code available for payment source

## 2018-05-06 ENCOUNTER — Other Ambulatory Visit (HOSPITAL_COMMUNITY)
Admission: RE | Admit: 2018-05-06 | Discharge: 2018-05-06 | Disposition: A | Discharge status: Home / Self Care | Payer: No Typology Code available for payment source | Source: Ambulatory Visit | Attending: Pulmonary Disease | Admitting: Pulmonary Disease

## 2018-05-06 DIAGNOSIS — Z113 Encounter for screening for infections with a predominantly sexual mode of transmission: Secondary | ICD-10-CM | POA: Diagnosis not present

## 2018-05-06 DIAGNOSIS — T50904S Poisoning by unspecified drugs, medicaments and biological substances, undetermined, sequela: Secondary | ICD-10-CM

## 2018-05-06 DIAGNOSIS — N390 Urinary tract infection, site not specified: Secondary | ICD-10-CM

## 2018-05-06 LAB — BASIC METABOLIC PANEL
Anion gap: 8 (ref 5–15)
BUN: 15 mg/dL (ref 6–20)
CALCIUM: 8.1 mg/dL — AB (ref 8.9–10.3)
CO2: 21 mmol/L — ABNORMAL LOW (ref 22–32)
CREATININE: 1.25 mg/dL — AB (ref 0.61–1.24)
Chloride: 111 mmol/L (ref 98–111)
GFR calc Af Amer: >60 mL/min (ref >60)
GFR calc non Af Amer: >60 mL/min (ref >60)
Glucose, Bld: 84 mg/dL (ref 70–99)
Potassium: 3.3 mmol/L — ABNORMAL LOW (ref 3.5–5.1)
Sodium: 140 mmol/L (ref 135–145)

## 2018-05-06 LAB — HEPATIC FUNCTION PANEL
ALBUMIN: 3.1 g/dL — AB (ref 3.5–5.0)
ALT: 3063 U/L — ABNORMAL HIGH (ref 0–44)
AST: 2481 U/L — AB (ref 15–41)
Alkaline Phosphatase: 64 U/L (ref 38–126)
BILIRUBIN DIRECT: 0.4 mg/dL — AB (ref 0.0–0.2)
Indirect Bilirubin: 0.7 mg/dL (ref 0.3–0.9)
Total Bilirubin: 1.1 mg/dL (ref 0.3–1.2)
Total Protein: 5.5 g/dL — ABNORMAL LOW (ref 6.5–8.1)

## 2018-05-06 LAB — CBC
HCT: 39 % (ref 39.0–52.0)
Hemoglobin: 12.8 g/dL — ABNORMAL LOW (ref 13.0–17.0)
MCH: 29.7 pg (ref 26.0–34.0)
MCHC: 32.8 g/dL (ref 30.0–36.0)
MCV: 90.5 fL (ref 80.0–100.0)
PLATELETS: 200 10*3/uL (ref 150–400)
RBC: 4.31 MIL/uL (ref 4.22–5.81)
RDW: 12.3 % (ref 11.5–15.5)
WBC: 10.9 10*3/uL — AB (ref 4.0–10.5)
nRBC: 0 % (ref 0.0–0.2)

## 2018-05-06 LAB — BLOOD GAS, ARTERIAL
Acid-base deficit: 1.8 mmol/L (ref 0.0–2.0)
BICARBONATE: 21.9 mmol/L (ref 20.0–28.0)
Drawn by: 51133
FIO2: 0.4
LHR: 14 {breaths}/min
O2 Saturation: 98.8 %
PATIENT TEMPERATURE: 99.1
PCO2 ART: 33.9 mmHg (ref 32.0–48.0)
PEEP: 5 cmH2O
PO2 ART: 129 mmHg — AB (ref 83.0–108.0)
VT: 630 mL
pH, Arterial: 7.428 (ref 7.350–7.450)

## 2018-05-06 LAB — ABO/RH: ABO/RH(D): A POS

## 2018-05-06 LAB — URINE CULTURE: CULTURE: NO GROWTH

## 2018-05-06 LAB — ACETAMINOPHEN LEVEL: Acetaminophen (Tylenol), Serum: <10 ug/mL — ABNORMAL LOW (ref 10–30)

## 2018-05-06 LAB — HIV ANTIBODY (ROUTINE TESTING W REFLEX): HIV SCREEN 4TH GENERATION: NONREACTIVE

## 2018-05-06 MED ORDER — CHLORHEXIDINE GLUCONATE 0.12% ORAL RINSE (MEDLINE KIT)
15.0000 mL | Freq: Two times a day (BID) | OROMUCOSAL | Status: DC
  Administered 2018-05-06: 15 mL via OROMUCOSAL

## 2018-05-06 MED ORDER — LORAZEPAM 2 MG/ML IJ SOLN
0.5000 mg | INTRAMUSCULAR | Status: DC | PRN
  Administered 2018-05-07 – 2018-05-08 (×2): 1 mg via INTRAVENOUS
  Filled 2018-05-06 (×2): qty 1

## 2018-05-06 MED ORDER — ORAL CARE MOUTH RINSE
15.0000 mL | OROMUCOSAL | Status: DC
  Administered 2018-05-06: 15 mL via OROMUCOSAL

## 2018-05-06 MED ORDER — ORAL CARE MOUTH RINSE: 15.0000 mL | Freq: Two times a day (BID) | OROMUCOSAL | Status: DC

## 2018-05-06 MED ORDER — POTASSIUM CHLORIDE 20 MEQ/15ML (10%) PO SOLN
20.0000 meq | ORAL | Status: AC
  Administered 2018-05-06 (×2): 20 meq
  Filled 2018-05-06 (×2): qty 15

## 2018-05-06 MED ORDER — CHLORHEXIDINE GLUCONATE 0.12 % MT SOLN
15.0000 mL | Freq: Two times a day (BID) | OROMUCOSAL | Status: DC
  Administered 2018-05-07 – 2018-05-09 (×5): 15 mL via OROMUCOSAL
  Filled 2018-05-06 (×6): qty 15

## 2018-05-06 MED ORDER — FENTANYL CITRATE (PF) 100 MCG/2ML IJ SOLN: 12.5000 ug | INTRAMUSCULAR | Status: DC | PRN

## 2018-05-06 NOTE — Progress Notes (Signed)
NAME:  Roberto Sims, MRN:  [id number], DOB:  09-29-1995, LOS: 1 ADMISSION DATE:  05/05/2018, CONSULTATION DATE:  05/05/2018 REFERRING MD:  EDP, CHIEF COMPLAINT:  Found down   Brief History   22 y/o male with a history of substance abuse admitted for acute encephalopathy in the setting of unintentional drug overdose.  Past Medical History  Anxiety, substance abuse, panic attacks Significant Hospital Events   10/22 admission 10/23 fever  Consults: date of consult/date signed off & final recs:  none  Procedures (surgical and bedside):  10/22 ETT >   Significant Diagnostic Tests:  10/22 CT head > NAICP  Micro Data:  10/22 blood > neg 10/22 urine > neg  Antimicrobials:  10/22 unasyn >   Subjective:  Fever overnight Otherwise no acute events Passing SBT    Objective   Blood pressure 107/68, pulse 78, temperature 97.9 F (36.6 C), temperature source Bladder, resp. rate 14, height 6' (1.829 m), weight 91 kg, SpO2 98 %.    Vent Mode: PRVC FiO2 (%):  [40 %-60 %] 40 % Set Rate:  [14 bmp] 14 bmp Vt Set:  [630 mL] 630 mL PEEP:  [5 cmH20] 5 cmH20 Plateau Pressure:  [9 cmH20-32 cmH20] 17 cmH20   Intake/Output Summary (Last 24 hours) at 05/06/2018 0859 Last data filed at 05/06/2018 0800 Gross per 24 hour  Intake 3575.8 ml  Output 1355 ml  Net 2220.8 ml   Filed Weights   05/05/18 0915 05/05/18 1500 05/06/18 0500  Weight: 71 kg 89.5 kg 91 kg    Examination: General:  In bed on vent HENT: NCAT ETT in place PULM: CTA B, vent supported breathing CV: RRR, no mgr GI: BS+, soft, nontender MSK: normal bulk and tone Neuro: sedated on vent    Resolved Hospital Problem list     Assessment & Plan:  Acute respiratory failure with hypercapnea and hypoxemia due to drug overdose/inability to protect airway > SBT/WUA now > VAP prevention  Possible aspiration  > continue unasyn  UTI > unasyn > monitor urine culture > send urine chlamydia/gc  AKI> resolving  presumably shock/hypoxemia related >Monitor BMET and UOP >Replace electrolytes as needed  Hypokalemia > replace  Transaminitis > presumably shock/hypoxemic related to admission > repeat LFT now > check acetaminophen level now  Elvated amylase/normal lipase: undetermined significance > repeat   Disposition / Summary of Today's Plan 05/06/18   Read above     Diet: advance after extubation Pain/Anxiety/Delirium protocol (if indicated): PAD protocol now VAP protocol (if indicated): yes DVT prophylaxis: sub q hep GI prophylaxis: famotidine Hyperglycemia protocol: n/a Mobility: advance after extubation Code Status: full Family Communication: parents updated bedside  Labs   CBC: Recent Labs  Lab 05/05/18 0914 05/05/18 0931 05/05/18 1331 05/06/18 0351  WBC 20.8*  --  9.8 10.9*  NEUTROABS 16.0*  --   --   --   HGB 15.8 16.7 14.6 12.8*  HCT 51.1 49.0 44.1 39.0  MCV 94.6  --  90.2 90.5  PLT 332  --  252 200    Basic Metabolic Panel: Recent Labs  Lab 05/05/18 0914 05/05/18 0931 05/05/18 1331 05/06/18 0351  NA 140 141 141 140  K 4.3 4.3 3.8 3.3*  CL 104 104 110 111  CO2 17*  --  22 21*  GLUCOSE 173* 172* 111* 84  BUN 12 17 12 15   CREATININE 2.12* 2.30* 1.44* 1.25*  CALCIUM 8.8*  --  7.8* 8.1*  MG  --   --  2.0  --  PHOS  --   --  3.7  --    GFR: Estimated Creatinine Clearance: 100.9 mL/min (A) (by C-G formula based on SCr of 1.25 mg/dL (H)). Recent Labs  Lab 05/05/18 0914 05/05/18 0932 05/05/18 1331 05/06/18 0351  PROCALCITON  --   --  1.61  --   WBC 20.8*  --  9.8 10.9*  LATICACIDVEN  --  8.98* 2.7*  --     Liver Function Tests: Recent Labs  Lab 05/05/18 0914 05/05/18 1331  AST 541* 2,401*  ALT 1,012* 3,387*  ALKPHOS 90 71  BILITOT 1.2 1.3*  PROT 7.6 6.3*  ALBUMIN 4.3 3.6   Recent Labs  Lab 05/05/18 1331  LIPASE 29  AMYLASE 1,008*   No results for input(s): AMMONIA in the last 168 hours.  ABG    Component Value Date/Time   PHART  7.428 05/06/2018 0407   PCO2ART 33.9 05/06/2018 0407   PO2ART 129 (H) 05/06/2018 0407   HCO3 21.9 05/06/2018 0407   TCO2 22 05/05/2018 1044   ACIDBASEDEF 1.8 05/06/2018 0407   O2SAT 98.8 05/06/2018 0407     Coagulation Profile: Recent Labs  Lab 05/05/18 0914 05/05/18 1331  INR 1.18 1.28    Cardiac Enzymes: Recent Labs  Lab 05/05/18 1040 05/05/18 1331  CKTOTAL 153 179    HbA1C: No results found for: HGBA1C  CBG: Recent Labs  Lab 05/05/18 0913 05/05/18 1201 05/05/18 1206 05/05/18 1252 05/05/18 1552  GLUCAP 173* 58* 58* 127* 78       Critical care time: 37 minutes    Heber Hailey, MD [place] [facility] Pager: [phone] Cell: [phone] After 3pm or if no response, call [phone]

## 2018-05-06 NOTE — Procedures (Signed)
Extubation Procedure Note  Patient Details:   Name: Zameer Borman DOB: [birth year] MRN: [id number]   Airway Documentation:    Vent end date: 05/06/18 Vent end time: 1053   Evaluation  O2 sats: stable throughout Complications: No apparent complications Patient did tolerate procedure well. Bilateral Breath Sounds: Clear   Yes   Pt extubated to 3 L but increased to 5L based on spo2 post extubation. Positive cuff leak noted prior to extubation. Pt able to cough and speak and was encouraged to use Yankauer to clear secretions. No stridor noted. VS within normal limits.   Ermalinda Barrios M 05/06/2018, 11:01 AM

## 2018-05-06 NOTE — Progress Notes (Signed)
[facility] ADULT ICU REPLACEMENT PROTOCOL FOR AM LAB REPLACEMENT ONLY  The patient does apply for the [facility] Adult ICU Electrolyte Replacment Protocol based on the criteria listed below:   1. Is GFR >/= 40 ml/min? Yes.    Patient's GFR today is >60 2. Is urine output >/= 0.5 ml/kg/hr for the last 6 hours? Yes.   Patient's UOP is .6 ml/kg/hr 3. Is BUN < 60 mg/dL? Yes.    Patient's BUN today is 15 4. Abnormal electrolyte(s): K-3.3 5. Ordered repletion with: per protocol 6. If a panic level lab has been reported, has the CCM MD in charge been notified? Yes.  .   Physician:  Dr. Edwin Dada, Dixon Boos 05/06/2018 6:16 AM

## 2018-05-07 ENCOUNTER — Other Ambulatory Visit: Payer: Self-pay

## 2018-05-07 ENCOUNTER — Inpatient Hospital Stay (HOSPITAL_COMMUNITY): Payer: No Typology Code available for payment source

## 2018-05-07 ENCOUNTER — Encounter (HOSPITAL_COMMUNITY): Payer: Self-pay | Admitting: General Practice

## 2018-05-07 LAB — BASIC METABOLIC PANEL
ANION GAP: 9 (ref 5–15)
BUN: 6 mg/dL (ref 6–20)
CO2: 21 mmol/L — AB (ref 22–32)
Calcium: 8 mg/dL — ABNORMAL LOW (ref 8.9–10.3)
Chloride: 108 mmol/L (ref 98–111)
Creatinine, Ser: 1.08 mg/dL (ref 0.61–1.24)
GFR calc Af Amer: >60 mL/min (ref >60)
GLUCOSE: 115 mg/dL — AB (ref 70–99)
Potassium: 3 mmol/L — ABNORMAL LOW (ref 3.5–5.1)
Sodium: 138 mmol/L (ref 135–145)

## 2018-05-07 LAB — GC/CHLAMYDIA PROBE AMP (~~LOC~~) NOT AT ARMC
CHLAMYDIA, DNA PROBE: NEGATIVE
Neisseria Gonorrhea: NEGATIVE

## 2018-05-07 MED ORDER — AMOXICILLIN-POT CLAVULANATE 875-125 MG PO TABS
1.0000 | ORAL_TABLET | Freq: Two times a day (BID) | ORAL | Status: DC
  Administered 2018-05-07 – 2018-05-09 (×4): 1 via ORAL
  Filled 2018-05-07 (×5): qty 1

## 2018-05-07 MED ORDER — AMOXICILLIN-POT CLAVULANATE 875-125 MG PO TABS
1.0000 | ORAL_TABLET | Freq: Two times a day (BID) | ORAL | Status: DC
  Filled 2018-05-07: qty 1

## 2018-05-07 MED ORDER — POTASSIUM CHLORIDE CRYS ER 20 MEQ PO TBCR
40.0000 meq | EXTENDED_RELEASE_TABLET | Freq: Once | ORAL | Status: AC
  Administered 2018-05-07: 40 meq via ORAL
  Filled 2018-05-07: qty 2

## 2018-05-07 MED ORDER — INFLUENZA VAC SPLIT QUAD 0.5 ML IM SUSY
0.5000 mL | PREFILLED_SYRINGE | INTRAMUSCULAR | Status: AC
  Administered 2018-05-08: 0.5 mL via INTRAMUSCULAR

## 2018-05-07 NOTE — Progress Notes (Signed)
NAME:  Roberto Sims, MRN:  [id number], DOB:  1995/12/07, LOS: 2 ADMISSION DATE:  05/05/2018, CONSULTATION DATE:  05/05/2018 REFERRING MD:  EDP, CHIEF COMPLAINT:  Found down   Brief History   22 y/o male with a history of substance abuse admitted for acute encephalopathy in the setting of unintentional drug overdose.  Past Medical History  Anxiety, substance abuse, panic attacks Significant Hospital Events   10/22 admission 10/23 fever  Consults: date of consult/date signed off & final recs:  none  Procedures (surgical and bedside):  10/22 ETT > 10/23  Significant Diagnostic Tests:  10/22 CT head > NAICP  Micro Data:  10/22 blood > neg 10/22 urine > neg  Antimicrobials:  10/22 unasyn >   Subjective:   Extubated on 10/23 Feels OK Appetite not great   Objective   Blood pressure 123/71, pulse 100, temperature 98.9 F (37.2 C), temperature source Oral, resp. rate (!) 25, height 6' (1.829 m), weight 94.2 kg, SpO2 (!) 85 %.        Intake/Output Summary (Last 24 hours) at 05/07/2018 1008 Last data filed at 05/07/2018 0600 Gross per 24 hour  Intake 2886.57 ml  Output 2170 ml  Net 716.57 ml   Filed Weights   05/05/18 1500 05/06/18 0500 05/07/18 0500  Weight: 89.5 kg 91 kg 94.2 kg    Examination:  General:  Resting comfortably in bed HENT: NCAT OP clear PULM: CTA B, normal effort CV: RRR, no mgr GI: BS+, soft, nontender MSK: normal bulk and tone Neuro: awake, alert, no distress, MAEW     Resolved Hospital Problem list     Assessment & Plan:  Acute respiratory failure with hypercapnea and hypoxemia due to drug overdose/inability to protect airway> resolved > aspiration precautions  Possible aspiration  > change unasyn to augmentin > repeat CXR  Mild hypoxemia due to pneumonia > repeat CXR  UTI > change unasyn to augmentin > f/u urine gc/chlamydia  AKI> resolved Monitor BMET and UOP Replace electrolytes as needed  Hypokalemia >  Potassium  Transaminitis > essentially unchanged today > check acute hepatitis panel > check ultrasound of liver > repeat LFT in AM   Disposition / Summary of Today's Plan 05/07/18   Read above     Diet: advance after extubation Pain/Anxiety/Delirium protocol (if indicated): PAD protocol now VAP protocol (if indicated): yes DVT prophylaxis: sub q hep GI prophylaxis: famotidine Hyperglycemia protocol: n/a Mobility: advance after extubation Code Status: full Family Communication: parents updated bedside  Labs   CBC: Recent Labs  Lab 05/05/18 0914 05/05/18 0931 05/05/18 1331 05/06/18 0351  WBC 20.8*  --  9.8 10.9*  NEUTROABS 16.0*  --   --   --   HGB 15.8 16.7 14.6 12.8*  HCT 51.1 49.0 44.1 39.0  MCV 94.6  --  90.2 90.5  PLT 332  --  252 200    Basic Metabolic Panel: Recent Labs  Lab 05/05/18 0914 05/05/18 0931 05/05/18 1331 05/06/18 0351 05/07/18 0355  NA 140 141 141 140 138  K 4.3 4.3 3.8 3.3* 3.0*  CL 104 104 110 111 108  CO2 17*  --  22 21* 21*  GLUCOSE 173* 172* 111* 84 115*  BUN 12 17 12 15 6   CREATININE 2.12* 2.30* 1.44* 1.25* 1.08  CALCIUM 8.8*  --  7.8* 8.1* 8.0*  MG  --   --  2.0  --   --   PHOS  --   --  3.7  --   --  GFR: Estimated Creatinine Clearance: 126.7 mL/min (by C-G formula based on SCr of 1.08 mg/dL). Recent Labs  Lab 05/05/18 0914 05/05/18 0932 05/05/18 1331 05/06/18 0351  PROCALCITON  --   --  1.61  --   WBC 20.8*  --  9.8 10.9*  LATICACIDVEN  --  8.98* 2.7*  --     Liver Function Tests: Recent Labs  Lab 05/05/18 0914 05/05/18 1331 05/06/18 1010  AST 541* 2,401* 2,481*  ALT 1,012* 3,387* 3,063*  ALKPHOS 90 71 64  BILITOT 1.2 1.3* 1.1  PROT 7.6 6.3* 5.5*  ALBUMIN 4.3 3.6 3.1*   Recent Labs  Lab 05/05/18 1331  LIPASE 29  AMYLASE 1,008*   No results for input(s): AMMONIA in the last 168 hours.  ABG    Component Value Date/Time   PHART 7.428 05/06/2018 0407   PCO2ART 33.9 05/06/2018 0407   PO2ART 129  (H) 05/06/2018 0407   HCO3 21.9 05/06/2018 0407   TCO2 22 05/05/2018 1044   ACIDBASEDEF 1.8 05/06/2018 0407   O2SAT 98.8 05/06/2018 0407     Coagulation Profile: Recent Labs  Lab 05/05/18 0914 05/05/18 1331  INR 1.18 1.28    Cardiac Enzymes: Recent Labs  Lab 05/05/18 1040 05/05/18 1331  CKTOTAL 153 179    HbA1C: No results found for: HGBA1C  CBG: Recent Labs  Lab 05/05/18 0913 05/05/18 1201 05/05/18 1206 05/05/18 1252 05/05/18 1552  GLUCAP 173* 58* 58* 127* 78      Heber [employer], MD [place] [facility] Pager: [phone] Cell: [phone] After 3pm or if no response, call [phone]

## 2018-05-08 DIAGNOSIS — T17908S Unspecified foreign body in respiratory tract, part unspecified causing other injury, sequela: Secondary | ICD-10-CM

## 2018-05-08 DIAGNOSIS — G4700 Insomnia, unspecified: Secondary | ICD-10-CM

## 2018-05-08 DIAGNOSIS — R7401 Elevation of levels of liver transaminase levels: Secondary | ICD-10-CM | POA: Diagnosis present

## 2018-05-08 DIAGNOSIS — F1994 Other psychoactive substance use, unspecified with psychoactive substance-induced mood disorder: Secondary | ICD-10-CM

## 2018-05-08 DIAGNOSIS — T50901A Poisoning by unspecified drugs, medicaments and biological substances, accidental (unintentional), initial encounter: Secondary | ICD-10-CM | POA: Diagnosis present

## 2018-05-08 DIAGNOSIS — Y902 Blood alcohol level of 40-59 mg/100 ml: Secondary | ICD-10-CM

## 2018-05-08 DIAGNOSIS — F419 Anxiety disorder, unspecified: Secondary | ICD-10-CM

## 2018-05-08 DIAGNOSIS — J690 Pneumonitis due to inhalation of food and vomit: Secondary | ICD-10-CM | POA: Diagnosis present

## 2018-05-08 DIAGNOSIS — R4182 Altered mental status, unspecified: Secondary | ICD-10-CM

## 2018-05-08 DIAGNOSIS — T1491XA Suicide attempt, initial encounter: Secondary | ICD-10-CM

## 2018-05-08 DIAGNOSIS — R740 Nonspecific elevation of levels of transaminase and lactic acid dehydrogenase [LDH]: Secondary | ICD-10-CM

## 2018-05-08 DIAGNOSIS — F1010 Alcohol abuse, uncomplicated: Secondary | ICD-10-CM

## 2018-05-08 DIAGNOSIS — J9601 Acute respiratory failure with hypoxia: Secondary | ICD-10-CM

## 2018-05-08 DIAGNOSIS — J9602 Acute respiratory failure with hypercapnia: Secondary | ICD-10-CM

## 2018-05-08 DIAGNOSIS — T424X2A Poisoning by benzodiazepines, intentional self-harm, initial encounter: Secondary | ICD-10-CM

## 2018-05-08 LAB — CBC
HEMATOCRIT: 42.5 % (ref 39.0–52.0)
HEMOGLOBIN: 14.3 g/dL (ref 13.0–17.0)
MCH: 29.7 pg (ref 26.0–34.0)
MCHC: 33.6 g/dL (ref 30.0–36.0)
MCV: 88.2 fL (ref 80.0–100.0)
NRBC: 0 % (ref 0.0–0.2)
Platelets: 180 10*3/uL (ref 150–400)
RBC: 4.82 MIL/uL (ref 4.22–5.81)
RDW: 11.9 % (ref 11.5–15.5)
WBC: 7.3 10*3/uL (ref 4.0–10.5)

## 2018-05-08 LAB — COMPREHENSIVE METABOLIC PANEL
ALT: 1561 U/L — AB (ref 0–44)
ANION GAP: 10 (ref 5–15)
AST: 277 U/L — ABNORMAL HIGH (ref 15–41)
Albumin: 3.4 g/dL — ABNORMAL LOW (ref 3.5–5.0)
Alkaline Phosphatase: 85 U/L (ref 38–126)
BUN: 5 mg/dL — ABNORMAL LOW (ref 6–20)
CHLORIDE: 107 mmol/L (ref 98–111)
CO2: 22 mmol/L (ref 22–32)
Calcium: 9.1 mg/dL (ref 8.9–10.3)
Creatinine, Ser: 0.97 mg/dL (ref 0.61–1.24)
Glucose, Bld: 109 mg/dL — ABNORMAL HIGH (ref 70–99)
POTASSIUM: 3.7 mmol/L (ref 3.5–5.1)
SODIUM: 139 mmol/L (ref 135–145)
Total Bilirubin: 1.6 mg/dL — ABNORMAL HIGH (ref 0.3–1.2)
Total Protein: 6.4 g/dL — ABNORMAL LOW (ref 6.5–8.1)

## 2018-05-08 LAB — HEPATITIS PANEL, ACUTE
HEP A IGM: NEGATIVE
HEP B C IGM: NEGATIVE
HEP B S AG: NEGATIVE

## 2018-05-08 MED ORDER — ONDANSETRON HCL 4 MG/2ML IJ SOLN
4.0000 mg | Freq: Four times a day (QID) | INTRAMUSCULAR | Status: DC | PRN
  Administered 2018-05-08: 4 mg via INTRAVENOUS
  Filled 2018-05-08: qty 2

## 2018-05-08 MED ORDER — ESCITALOPRAM OXALATE 10 MG PO TABS
10.0000 mg | ORAL_TABLET | Freq: Every day | ORAL | Status: DC
  Administered 2018-05-08 – 2018-05-09 (×2): 10 mg via ORAL
  Filled 2018-05-08 (×2): qty 1

## 2018-05-08 MED ORDER — FAMOTIDINE 20 MG PO TABS
20.0000 mg | ORAL_TABLET | Freq: Two times a day (BID) | ORAL | Status: DC
  Administered 2018-05-08 – 2018-05-09 (×2): 20 mg via ORAL
  Filled 2018-05-08 (×2): qty 1

## 2018-05-08 MED ORDER — GUAIFENESIN-DM 100-10 MG/5ML PO SYRP: 5.0000 mL | ORAL_SOLUTION | ORAL | Status: DC | PRN

## 2018-05-08 MED ORDER — ONDANSETRON HCL 4 MG/2ML IJ SOLN: 4.0000 mg | Freq: Four times a day (QID) | INTRAMUSCULAR | Status: DC | PRN

## 2018-05-08 MED ORDER — GABAPENTIN 100 MG PO CAPS
100.0000 mg | ORAL_CAPSULE | Freq: Three times a day (TID) | ORAL | Status: DC
  Administered 2018-05-08 – 2018-05-09 (×3): 100 mg via ORAL
  Filled 2018-05-08 (×3): qty 1

## 2018-05-08 MED ORDER — BENZONATATE 100 MG PO CAPS
100.0000 mg | ORAL_CAPSULE | Freq: Three times a day (TID) | ORAL | Status: DC
  Administered 2018-05-08 (×3): 100 mg via ORAL
  Filled 2018-05-08 (×3): qty 1

## 2018-05-08 NOTE — Consult Note (Addendum)
[facility] Consult   Reason for Consult:  History of drug abuse, anxiety with overdose. Referring Physician:  Dr. Tana Coast Patient Identification: Roberto Sims MRN:  [id number] Principal Diagnosis: Substance induced mood disorder [facility]) Diagnosis:   Patient Active Problem List   Diagnosis Date Noted  . Acute respiratory failure with hypoxia and hypercapnia (HCC) [J96.01, J96.02] 05/08/2018  . Overdose, accidental or unintentional, initial encounter [T50.901A] 05/08/2018  . Anxiety [F41.9] 05/08/2018  . Aspiration pneumonia ([place]) [J69.0] 05/08/2018  . Transaminitis [R74.0] 05/08/2018    Total Time spent with patient: 1 hour  Subjective:   Roberto Sims is a 22 y.o. male patient admitted with acute respiratory failure with hypercapnia and hypoxemia.  HPI:   Per chart review, patient was admitted with respiratory failure with hypercapnia and hypoxemia due to accidental drug overdose with Xanax and alcohol use. BAL was 50 and UDS was positive for cocaine, marijuana and benzodiazepines. AST is 277 and ALT is 1561 today. He was obtunded on admission. He required intubation for airway protection. He was reportedly partying with his friends. Psychiatry was consulted for anxiety and outpatient resources. Home medications include Xanax 2-6 mg TID PRN. PMP does not indicate that patient been prescribed controlled substances. He received Ativan 1 mg overnight for anxiety.   On interview, Roberto Sims reports daily Xanax use.  He uses up to 2-4 mg daily.  He reports regular cocaine and alcohol use as well.  He reports episodic binge drinking with subsequent cocaine use.  He reports experiencing withdrawal symptoms when he does not take Xanax.  He denies a history of seizures.  He reports his longest period of sobriety was 6 months while he was in rehab in [place].  He was also attending meetings.  He reports that he feels depressed and anxious and this is the reason he self medicates  with substance use.  He has had a problem with substance abuse for several years.  He denies having serious health complications from substance use in the past.  He believes that his cocaine was laced with Fentanyl and this contributed to his recent presentation.  He denies SI, HI or AVH.  He reports difficulty with falling asleep.  He reports poor appetite but denies weight loss.  Past Psychiatric History: Polysubstance abuse and history of suicide attempt by cutting at 22 y/o.  Risk to Self:  None. Denies SI.  Risk to Others:  None. Denies HI.  Prior Inpatient Therapy:  Denies  Prior Outpatient Therapy:  Denies  Past Medical History:  Past Medical History:  Diagnosis Date  . Respiratory failure, acute ([place]) 05/05/2018    Past Surgical History:  Procedure Laterality Date  . boxers fracture Right   . great toe fracture Right 2008   Fx. growth plate  . KNEE ARTHROSCOPY Left    Family History: History reviewed. No pertinent family history. Family Psychiatric  History: Father-alcoholism.  Social History:  Social History   Substance and Sexual Activity  Alcohol Use Yes   Comment: 0ccasional     Social History   Substance and Sexual Activity  Drug Use Yes  . Types: Cocaine, Marijuana, Benzodiazepines    Social History   Socioeconomic History  . Marital status: Single    Spouse name: Not on file  . Number of children: Not on file  . Years of education: Not on file  . Highest education level: Not on file  Occupational History  . Not on file  Social Needs  . Financial resource strain:  Not on file  . Food insecurity:    Worry: Not on file    Inability: Not on file  . Transportation needs:    Medical: Not on file    Non-medical: Not on file  Tobacco Use  . Smoking status: Current Some Day Smoker  . Smokeless tobacco: Never Used  Substance and Sexual Activity  . Alcohol use: Yes    Comment: 0ccasional  . Drug use: Yes    Types: Cocaine, Marijuana, Benzodiazepines  .  Sexual activity: Not on file  Lifestyle  . Physical activity:    Days per week: Not on file    Minutes per session: Not on file  . Stress: Not on file  Relationships  . Social connections:    Talks on phone: Not on file    Gets together: Not on file    Attends religious service: Not on file    Active member of club or organization: Not on file    Attends meetings of clubs or organizations: Not on file    Relationship status: Not on file  Other Topics Concern  . Not on file  Social History Narrative  . Not on file   Additional Social History: He lives at home with his girlfriend. He is a Chief Operating Officer at [employer]. He reports cocaine, alcohol and Xanax abuse.     Allergies:  No Known Allergies  Labs:  Results for orders placed or performed during the hospital encounter of 05/05/18 (from the past 48 hour(s))  BMET in AM     Status: Abnormal   Collection Time: 05/07/18  3:55 AM  Result Value Ref Range   Sodium 138 135 - 145 mmol/L   Potassium 3.0 (L) 3.5 - 5.1 mmol/L   Chloride 108 98 - 111 mmol/L   CO2 21 (L) 22 - 32 mmol/L   Glucose, Bld 115 (H) 70 - 99 mg/dL   BUN 6 6 - 20 mg/dL   Creatinine, Ser 1.08 0.61 - 1.24 mg/dL   Calcium 8.0 (L) 8.9 - 10.3 mg/dL   GFR calc non Af Amer >60 >60 mL/min   GFR calc Af Amer >60 >60 mL/min    Comment: (NOTE) The eGFR has been calculated using the CKD EPI equation. This calculation has not been validated in all clinical situations. eGFR's persistently <60 mL/min signify possible Chronic Kidney Disease.    Anion gap 9 5 - 15    Comment: Performed at [facility] [address], [place], [place] [address]  Hepatitis panel, acute     Status: None   Collection Time: 05/07/18 11:08 AM  Result Value Ref Range   Hepatitis B Surface Ag Negative Negative   HCV Ab <0.1 0.0 - 0.9 s/co ratio    Comment: (NOTE)                                  Negative:     < 0.8                             Indeterminate: 0.8 - 0.9                                   Positive:     > 0.9 The CDC recommends that a positive HCV antibody result be followed up with  a HCV Nucleic Acid Amplification test ([id number]). Performed At: [facility] [place], [place] [id number] Rush Farmer MD SM:[id number]    Hep A IgM Negative Negative   Hep B C IgM Negative Negative  Comprehensive metabolic panel     Status: Abnormal   Collection Time: 05/08/18  6:56 AM  Result Value Ref Range   Sodium 139 135 - 145 mmol/L   Potassium 3.7 3.5 - 5.1 mmol/L   Chloride 107 98 - 111 mmol/L   CO2 22 22 - 32 mmol/L   Glucose, Bld 109 (H) 70 - 99 mg/dL   BUN 5 (L) 6 - 20 mg/dL   Creatinine, Ser 0.97 0.61 - 1.24 mg/dL   Calcium 9.1 8.9 - 10.3 mg/dL   Total Protein 6.4 (L) 6.5 - 8.1 g/dL   Albumin 3.4 (L) 3.5 - 5.0 g/dL   AST 277 (H) 15 - 41 U/L   ALT 1,561 (H) 0 - 44 U/L   Alkaline Phosphatase 85 38 - 126 U/L   Total Bilirubin 1.6 (H) 0.3 - 1.2 mg/dL   GFR calc non Af Amer >60 >60 mL/min   GFR calc Af Amer >60 >60 mL/min    Comment: (NOTE) The eGFR has been calculated using the CKD EPI equation. This calculation has not been validated in all clinical situations. eGFR's persistently <60 mL/min signify possible Chronic Kidney Disease.    Anion gap 10 5 - 15    Comment: Performed at [facility] [address] [place], [place] [address]  CBC     Status: None   Collection Time: 05/08/18  6:56 AM  Result Value Ref Range   WBC 7.3 4.0 - 10.5 K/uL   RBC 4.82 4.22 - 5.81 MIL/uL   Hemoglobin 14.3 13.0 - 17.0 g/dL   HCT 42.5 39.0 - 52.0 %   MCV 88.2 80.0 - 100.0 fL   MCH 29.7 26.0 - 34.0 pg   MCHC 33.6 30.0 - 36.0 g/dL   RDW 11.9 11.5 - 15.5 %   Platelets 180 150 - 400 K/uL   nRBC 0.0 0.0 - 0.2 %    Comment: Performed at [facility], [place] [address], [place], [place] [address]    Current Facility-Administered Medications  Medication Dose Route Frequency Provider Last Rate Last Dose  . 0.9 %  sodium chloride infusion  250 mL  Intravenous Continuous Minor, Grace Bushy, NP      . albuterol (PROVENTIL) (2.5 MG/3ML) 0.083% nebulizer solution 2.5 mg  2.5 mg Nebulization Q2H PRN Minor, Grace Bushy, NP      . amoxicillin-clavulanate (AUGMENTIN) 875-125 MG per tablet 1 tablet  1 tablet Oral Q12H Juanito Doom, MD   1 tablet at 05/08/18 0533  . benzonatate (TESSALON) capsule 100 mg  100 mg Oral TID Rai, Ripudeep K, MD   100 mg at 05/08/18 1114  . chlorhexidine (PERIDEX) 0.12 % solution 15 mL  15 mL Mouth Rinse BID Simonne Maffucci B, MD   15 mL at 05/08/18 0933  . famotidine (PEPCID) IVPB 20 mg premix  20 mg Intravenous Q12H Minor, Grace Bushy, NP 100 mL/hr at 05/08/18 0934 20 mg at 05/08/18 0934  . fentaNYL (SUBLIMAZE) injection 12.5-25 mcg  12.5-25 mcg Intravenous Q2H PRN Simonne Maffucci B, MD      . fentaNYL (SUBLIMAZE) injection 25-100 mcg  25-100 mcg Intravenous Q2H PRN Wilhelmina Mcardle, MD   100 mcg at 05/05/18 2134  . guaiFENesin-dextromethorphan (ROBITUSSIN DM) 100-10 MG/5ML syrup  5 mL  5 mL Oral Q4H PRN Rai, Ripudeep K, MD      . heparin injection 5,000 Units  5,000 Units Subcutaneous Q8H Minor, Grace Bushy, NP   5,000 Units at 05/08/18 0533  . LORazepam (ATIVAN) injection 0.5-1 mg  0.5-1 mg Intravenous Q4H PRN Juanito Doom, MD   1 mg at 05/07/18 2144  . MEDLINE mouth rinse  15 mL Mouth Rinse q12n4p McQuaid, Douglas B, MD      . morphine 4 MG/ML injection 4 mg  4 mg Intravenous Once Valarie Merino, MD   Stopped at 05/05/18 1019    Musculoskeletal: Strength & Muscle Tone: within normal limits Gait & Station: UTA since patient is lying in bed. Patient leans: N/A  Psychiatric Specialty Exam: Physical Exam  Nursing note and vitals reviewed. Constitutional: He is oriented to person, place, and time. He appears well-developed and well-nourished.  HENT:  Head: Normocephalic and atraumatic.  Neck: Normal range of motion.  Respiratory: Effort normal.  Musculoskeletal: Normal range of motion.  Neurological: He is  alert and oriented to person, place, and time.  Psychiatric: His speech is normal and behavior is normal. Judgment and thought content normal. His mood appears anxious. Cognition and memory are normal.    Review of Systems  Constitutional: Positive for chills. Negative for fever.  Gastrointestinal: Positive for vomiting. Negative for abdominal pain, constipation, diarrhea and nausea.  Psychiatric/Behavioral: Positive for depression and substance abuse. Negative for hallucinations and suicidal ideas. The patient is nervous/anxious and has insomnia.   All other systems reviewed and are negative.   Blood pressure 104/73, pulse 69, temperature 98.1 F (36.7 C), temperature source Oral, resp. rate 18, height 6' (1.829 m), weight 94.3 kg, SpO2 96 %.Body mass index is 28.2 kg/m.  General Appearance: Fairly Groomed, young, Hispanic male, wearing a hospital gown and lying in bed. NAD.   Eye Contact:  Good  Speech:  Clear and Coherent and Normal Rate  Volume:  Normal  Mood:  Anxious and Depressed  Affect:  Congruent  Thought Process:  Goal Directed, Linear and Descriptions of Associations: Intact  Orientation:  Full (Time, Place, and Person)  Thought Content:  Logical  Suicidal Thoughts:  No  Homicidal Thoughts:  No  Memory:  Immediate;   Good Recent;   Good Remote;   Good  Judgement:  Fair  Insight:  Fair  Psychomotor Activity:  Normal  Concentration:  Concentration: Good and Attention Span: Good  Recall:  Good  Fund of Knowledge:  Good  Language:  Good  Akathisia:  No  Handed:  Right  AIMS (if indicated):   N/A  Assets:  Communication Skills Desire for Improvement Financial Resources/Insurance Housing Intimacy Physical Health Resilience Social Support  ADL's:  Intact  Cognition:  WNL  Sleep:   Poor   Assessment:  Roberto Sims is a 22 y.o. male who was admitted with respiratory failure with hypercapnia and hypoxemia due to accidental drug overdose with Xanax and alcohol  use. He reports self-medicating with substances due to poorly managed social anxiety and depression. He endorses poor sleep and appetite. He denies SI, HI or AVH. Recommend Gabapentin for alcohol use and Lexapro for depression and anxiety. He agrees to establishing care with a psychiatrist to continue medication management.   Treatment Plan Summary: -Start Lexapro 10 mg daily for depression and anxiety. -Start Gabapentin 100 mg TID for alcohol use, anxiety and insomnia. Can increase nighttime dose if needed to assist with sleep. -EKG reviewed and QTc  425 on 10/24. Please closely monitor when starting or increasing QTc prolonging agents.  -Please have SW provide patient with resources for outpatient psychiatrist and substance abuse treatment such as intensive outpatient program.  -Psychiatry will sign off on patient at this time. Please consult psychiatry again as needed.   Disposition: No evidence of imminent risk to self or others at present.   Patient does not meet criteria for psychiatric inpatient admission.  Faythe Dingwall, DO 05/08/2018 11:29 AM

## 2018-05-08 NOTE — Progress Notes (Addendum)
[facility]                                                                              Patient Demographics  Roberto Sims, is a 22 y.o. male, DOB - [birth year], ZOX:[id number]  Admit date - 05/05/2018   Admitting Physician Oretha Milch, MD  Outpatient Primary MD for the patient is Ronnald Nian, MD  Outpatient specialists:   LOS - 3  days   Medical records reviewed and are as summarized below:    Chief Complaint  Patient presents with  . unresponsive       Brief summary   Patient is a 22 year old male with known history of substance abuse, Xanax and alcohol last seen normal by girlfriend around 3 AM on the night of admission, found obtunded around 7 AM and brought by EMS, intubated in ED for airway protection. Patient was admitted to ICU by CCM.  Alcohol level 50, urine toxicology positive for cocaine and benzodiazepines. Now extubated, patient was transferred to hospitalist service, assumed care on 10/25  Assessment & Plan    Principal problem Acute respiratory failure with hypercapnia and hypoxemia -Likely due to drug overdose and inability to protect airway -Currently resolved, O2 sats 96% on room air  Possible aspiration pneumonia -Chest x-ray 10/24 showed patchy peribronchial airspace opacities in the right lower lobe may represent aspiration pneumonitis -Patient was initially placed on IV Unasyn, transition to oral Augmentin -Placed on Tessalon Perles, Robitussin, continue incentive spirometry -Urine strep antigen negative  Acute kidney injury Likely due to overdose, #1, presented with creatinine of 2.3 Resolved with IV fluids, creatinine 0.97  Acute metabolic encephalopathy sec to Accidental overdose -Per patient, he was partying with his friends prior to admission, acknowledges and admits that he had taken Xanax, alcohol with cocaine -He has history of anxiety, wants to talk to psychiatry for resources and outpatient help with his  addiction.  Denied any SI or HI -Psych consult placed  Acute transaminitis/ liver failure due to drug overdose  Likely due to cocaine and overdose Right upper quadrant ultrasound negative for any acute issues LFTs now trending down, hepatitis panel negative AST 277 down from 2481, ALT 1561 down from 3063 yesterday   Code Status: Full CODE STATUS DVT Prophylaxis: Heparin subcu Family Communication: Discussed in detail with the patient, all imaging results, lab results explained to the patient and girlfriend in the room (with patient's permission)   Disposition Plan: Hopefully DC home tomorrow if no acute issues and cleared by psych  Time Spent in minutes 35 minutes  Procedures:  Intubation, extubation  Consultants:   Patient was admitted by CCM Psychiatry  Antimicrobials:      Medications  Scheduled Meds: . amoxicillin-clavulanate  1 tablet Oral Q12H  . chlorhexidine  15 mL Mouth Rinse BID  . heparin  5,000 Units Subcutaneous Q8H  . mouth rinse  15 mL Mouth Rinse q12n4p  .  morphine injection  4 mg Intravenous Once   Continuous Infusions: . sodium chloride    . famotidine (PEPCID) IV 20 mg (05/08/18 0934)   PRN Meds:.albuterol, fentaNYL (SUBLIMAZE) injection, fentaNYL (SUBLIMAZE) injection, LORazepam  Antibiotics   Anti-infectives (From admission, onward)   Start     Dose/Rate Route Frequency Ordered Stop   05/07/18 1600  amoxicillin-clavulanate (AUGMENTIN) 875-125 MG per tablet 1 tablet     1 tablet Oral Every 12 hours 05/07/18 1412     05/07/18 1200  amoxicillin-clavulanate (AUGMENTIN) 875-125 MG per tablet 1 tablet  Status:  Discontinued     1 tablet Oral Every 12 hours 05/07/18 1018 05/07/18 1412   05/05/18 1800  ampicillin-sulbactam (UNASYN) 1.5 g in sodium chloride 0.9 % 100 mL IVPB  Status:  Discontinued     1.5 g 200 mL/hr over 30 Minutes Intravenous Every 6 hours 05/05/18 1739 05/07/18 1026        Subjective:   Roberto Sims was seen and  examined today.  Still somewhat coughing but no fevers or chills.  No abdominal pain, nausea vomiting.  Feels better from the time of admission.  No acute events overnight.    Objective:   Vitals:   05/07/18 1617 05/07/18 2125 05/08/18 0521 05/08/18 0751  BP: 126/83 120/89 118/78 104/73  Pulse: 73 74 75 69  Resp: (!) 22 16 18 18   Temp: 97.8 F (36.6 C) 98.3 F (36.8 C) 98.7 F (37.1 C)   TempSrc: Oral Oral Oral   SpO2: 98% 96% 96% 96%  Weight:  94.3 kg    Height:        Intake/Output Summary (Last 24 hours) at 05/08/2018 1034 Last data filed at 05/07/2018 1200 Gross per 24 hour  Intake 53.73 ml  Output -  Net 53.73 ml     Wt Readings from Last 3 Encounters:  05/07/18 94.3 kg  12/06/10 71.2 kg (77 %, Z= 0.75)*  11/15/10 70.3 kg (76 %, Z= 0.70)*   * Growth percentiles are based on CDC (Boys, 2-20 Years) data.     Exam  General: Alert and oriented x 3, NAD  Eyes:   HEENT:  Atraumatic, normocephalic  Cardiovascular: S1 S2 auscultated,  Regular rate and rhythm.  Respiratory: Clear to auscultation bilaterally, no wheezing, rales or rhonchi  Gastrointestinal: Soft, nontender, nondistended, + bowel sounds  Ext: no pedal edema bilaterally  Neuro: no new deficits  Musculoskeletal: No digital cyanosis, clubbing  Skin: No rashes  Psych: Normal affect and demeanor, alert and oriented x3    Data Reviewed:  I have personally reviewed following labs and imaging studies  Micro Results Recent Results (from the past 240 hour(s))  Urine culture     Status: None   Collection Time: 05/05/18 11:12 AM  Result Value Ref Range Status   Specimen Description URINE, CATHETERIZED  Final   Special Requests NONE  Final   Culture   Final    NO GROWTH Performed at [facility], [address], [place], [place] [address]    Report Status 05/06/2018 FINAL  Final  MRSA PCR Screening     Status: None   Collection Time: 05/05/18 11:59 AM  Result Value Ref Range Status    MRSA by PCR NEGATIVE NEGATIVE Final    Comment:        The GeneXpert MRSA Assay (FDA approved for NASAL specimens only), is one component of a comprehensive MRSA colonization surveillance program. It is not intended to diagnose MRSA infection nor to guide or monitor treatment for MRSA infections. Performed at [facility], [address] [place], [place] [address]   Culture, blood (routine x 2)     Status: None (Preliminary result)  Collection Time: 05/05/18  7:10 PM  Result Value Ref Range Status   Specimen Description BLOOD RIGHT HAND  Final   Special Requests   Final    BOTTLES DRAWN AEROBIC ONLY Blood Culture results may not be optimal due to an inadequate volume of blood received in culture bottles   Culture   Final    NO GROWTH 3 DAYS Performed at [facility], [address] [place], [place] [address]    Report Status PENDING  Incomplete  Culture, blood (routine x 2)     Status: None (Preliminary result)   Collection Time: 05/05/18  7:20 PM  Result Value Ref Range Status   Specimen Description BLOOD RIGHT WRIST  Final   Special Requests   Final    BOTTLES DRAWN AEROBIC ONLY Blood Culture results may not be optimal due to an inadequate volume of blood received in culture bottles   Culture   Final    NO GROWTH 3 DAYS Performed at [facility], [address] [place], [place] [address]    Report Status PENDING  Incomplete    Radiology Reports Dg Chest 1 View  Result Date: 05/05/2018 CLINICAL DATA:  Intubation. EXAM: CHEST  1 VIEW COMPARISON:  04/15/2018 FINDINGS: Endotracheal tube tip at the clavicular heads. An orogastric tube and side-port reaches the stomach. Low volume chest with streaky left perihilar density. Likely normal heart size when accounting for accentuation by low volumes. IMPRESSION: 1. Unremarkable hardware positioning. 2. Low volume chest with left perihilar atelectasis. Electronically Signed   By: Marnee Spring M.D.   On:  05/05/2018 09:58   Dg Chest 2 View  Result Date: 05/07/2018 CLINICAL DATA:  Aspiration with cough and congestion. EXAM: CHEST - 2 VIEW COMPARISON:  05/06/2018 FINDINGS: Cardiomediastinal silhouette is normal. Mediastinal contours appear intact. There is no evidence of pleural effusion or pneumothorax. Patchy peribronchial airspace opacities in the right lower lobe. Osseous structures are without acute abnormality. Soft tissues are grossly normal. IMPRESSION: Patchy peribronchial airspace opacities in the right lower lobe may represent aspiration pneumonitis. Electronically Signed   By: Ted Mcalpine M.D.   On: 05/07/2018 12:35   Dg Chest 2 View  Result Date: 04/15/2018 CLINICAL DATA:  Cough over the last week, worsening recently. Productive. Fever. EXAM: CHEST - 2 VIEW COMPARISON:  None. FINDINGS: Patchy bronchopneumonia in the right perihilar region and mid lung. No dense consolidation or lobar collapse. The remainder the chest appears clear. No effusions. Heart and mediastinal shadows are normal. IMPRESSION: Patchy bronchopneumonia in the right mid lung and perihilar region. Electronically Signed   By: Paulina Fusi M.D.   On: 04/15/2018 13:38   Ct Head Wo Contrast  Result Date: 05/05/2018 CLINICAL DATA:  Altered level of consciousness, unresponsive, intubated EXAM: CT HEAD WITHOUT CONTRAST TECHNIQUE: Contiguous axial images were obtained from the base of the skull through the vertex without intravenous contrast. COMPARISON:  None. FINDINGS: Brain: The ventricular system is normal in size and configuration and the septum is in a normal midline position. The fourth ventricle and basilar cisterns are unremarkable. No hemorrhage, mass lesion, or acute infarction is seen. Vascular: No vascular abnormality is evident on this unenhanced study. Skull: On bone window images, no calvarial abnormality is seen. Sinuses/Orbits: There is some mucosal thickening throughout the ethmoid air cells and within the  maxillary sinuses but no air-fluid level is seen. Other: None IMPRESSION: 1. No acute intracranial abnormality. 2. Mild mucosal thickening in the ethmoid air cells and maxillary  sinuses. No air-fluid level. Electronically Signed   By: Dwyane Dee M.D.   On: 05/05/2018 10:41   Dg Chest Port 1 View  Result Date: 05/06/2018 CLINICAL DATA:  Acute respiratory failure EXAM: PORTABLE CHEST 1 VIEW COMPARISON:  05/05/2018 FINDINGS: Endotracheal tube tip 4.6 cm above the carina. Nasogastric tube enters the stomach. Low lung volumes are present, causing crowding of the pulmonary vasculature. Upper normal heart size. Mildly indistinct pulmonary vasculature on today's semi erect examination. No overt airspace opacity or pneumothorax. The left perihilar atelectasis shown on the prior exam is slightly less conspicuous today. IMPRESSION: 1. Satisfactorily positioned endotracheal and nasogastric tubes. 2. Low lung volumes are present, causing crowding of the pulmonary vasculature. 3. Improvement in the left perihilar atelectasis shown on the prior exam. Electronically Signed   By: Gaylyn Rong M.D.   On: 05/06/2018 10:25   US Abdomen Limited Ruq  Result Date: 05/07/2018 CLINICAL DATA:  Elevated LFTs EXAM: ULTRASOUND ABDOMEN LIMITED RIGHT UPPER QUADRANT COMPARISON:  None. FINDINGS: Gallbladder: A stone near the neck of the gallbladder is not excluded. No wall thickening, pericholecystic fluid, or Murphy's sign. Common bile duct: Diameter: 1.8 mm Liver: No focal lesion identified. Within normal limits in parenchymal echogenicity. Portal vein is patent on color Doppler imaging with normal direction of blood flow towards the liver. IMPRESSION: A stone near the neck of the gallbladder is not excluded on provided images. No wall thickening, pericholecystic fluid, or Murphy's sign. No other abnormalities. Electronically Signed   By: Gerome Sam III M.D   On: 05/07/2018 17:07    Lab Data:  CBC: Recent Labs  Lab  05/05/18 0914 05/05/18 0931 05/05/18 1331 05/06/18 0351 05/08/18 0656  WBC 20.8*  --  9.8 10.9* 7.3  NEUTROABS 16.0*  --   --   --   --   HGB 15.8 16.7 14.6 12.8* 14.3  HCT 51.1 49.0 44.1 39.0 42.5  MCV 94.6  --  90.2 90.5 88.2  PLT 332  --  252 200 180   Basic Metabolic Panel: Recent Labs  Lab 05/05/18 0914 05/05/18 0931 05/05/18 1331 05/06/18 0351 05/07/18 0355 05/08/18 0656  NA 140 141 141 140 138 139  K 4.3 4.3 3.8 3.3* 3.0* 3.7  CL 104 104 110 111 108 107  CO2 17*  --  22 21* 21* 22  GLUCOSE 173* 172* 111* 84 115* 109*  BUN 12 17 12 15 6  5*  CREATININE 2.12* 2.30* 1.44* 1.25* 1.08 0.97  CALCIUM 8.8*  --  7.8* 8.1* 8.0* 9.1  MG  --   --  2.0  --   --   --   PHOS  --   --  3.7  --   --   --    GFR: Estimated Creatinine Clearance: 141.2 mL/min (by C-G formula based on SCr of 0.97 mg/dL). Liver Function Tests: Recent Labs  Lab 05/05/18 0914 05/05/18 1331 05/06/18 1010 05/08/18 0656  AST 541* 2,401* 2,481* 277*  ALT 1,012* 3,387* 3,063* 1,561*  ALKPHOS 90 71 64 85  BILITOT 1.2 1.3* 1.1 1.6*  PROT 7.6 6.3* 5.5* 6.4*  ALBUMIN 4.3 3.6 3.1* 3.4*   Recent Labs  Lab 05/05/18 1331  LIPASE 29  AMYLASE 1,008*   No results for input(s): AMMONIA in the last 168 hours. Coagulation Profile: Recent Labs  Lab 05/05/18 0914 05/05/18 1331  INR 1.18 1.28   Cardiac Enzymes: Recent Labs  Lab 05/05/18 1040 05/05/18 1331  CKTOTAL 153 179   BNP (last 3  results) No results for input(s): PROBNP in the last 8760 hours. HbA1C: No results for input(s): HGBA1C in the last 72 hours. CBG: Recent Labs  Lab 05/05/18 0913 05/05/18 1201 05/05/18 1206 05/05/18 1252 05/05/18 1552  GLUCAP 173* 58* 58* 127* 78   Lipid Profile: Recent Labs    05/05/18 1431  TRIG 201*   Thyroid Function Tests: No results for input(s): TSH, T4TOTAL, FREET4, T3FREE, THYROIDAB in the last 72 hours. Anemia Panel: No results for input(s): VITAMINB12, FOLATE, FERRITIN, TIBC, IRON,  RETICCTPCT in the last 72 hours. Urine analysis:    Component Value Date/Time   COLORURINE YELLOW 05/05/2018 0921   APPEARANCEUR HAZY (A) 05/05/2018 0921   LABSPEC 1.006 05/05/2018 0921   PHURINE 6.0 05/05/2018 0921   GLUCOSEU 50 (A) 05/05/2018 0921   HGBUR SMALL (A) 05/05/2018 0921   BILIRUBINUR NEGATIVE 05/05/2018 0921   KETONESUR NEGATIVE 05/05/2018 0921   PROTEINUR 30 (A) 05/05/2018 0921   NITRITE NEGATIVE 05/05/2018 0921   LEUKOCYTESUR NEGATIVE 05/05/2018 0921     Ripudeep Rai M.D. [facility] 05/08/2018, 10:34 AM  Pager: [phone] Between 7am to 7pm - call Pager - [phone]  After 7pm go to [email or web address] - password [email or web address]  Call night coverage person covering after 7pm

## 2018-05-08 NOTE — Progress Notes (Signed)
PHARMACIST - PHYSICIAN COMMUNICATION  DR:   Isidoro Donning  CONCERNING: IV to Oral Route Change Policy  RECOMMENDATION: This patient is receiving Famotidine by the intravenous route.  Based on criteria approved by the Pharmacy and Therapeutics Committee, the intravenous medication(s) is/are being converted to the equivalent oral dose form(s).  DESCRIPTION: These criteria include:  The patient is eating (either orally or via tube) and/or has been taking other orally administered medications for a least 24 hours  The patient has no evidence of active gastrointestinal bleeding or impaired GI absorption (gastrectomy, short bowel, patient on TNA or NPO).  If you have questions about this conversion, please contact the Pharmacy Department  []   [phone] )  [facility] []   [phone] )  [facility] [x]   [phone] )  [facility] []   [phone] )  [facility] []   [phone] )  [facility]    Georgina Pillion, PharmD, BCPS  1:02 PM

## 2018-05-08 NOTE — Plan of Care (Signed)
  Problem: Clinical Measurements: Goal: Cardiovascular complication will be avoided Outcome: Progressing   Problem: Clinical Measurements: Goal: Respiratory complications will improve Outcome: Progressing   

## 2018-05-09 DIAGNOSIS — F1994 Other psychoactive substance use, unspecified with psychoactive substance-induced mood disorder: Secondary | ICD-10-CM

## 2018-05-09 DIAGNOSIS — T50901A Poisoning by unspecified drugs, medicaments and biological substances, accidental (unintentional), initial encounter: Secondary | ICD-10-CM

## 2018-05-09 DIAGNOSIS — T17908A Unspecified foreign body in respiratory tract, part unspecified causing other injury, initial encounter: Secondary | ICD-10-CM

## 2018-05-09 LAB — CBC
HCT: 43.9 % (ref 39.0–52.0)
Hemoglobin: 15.1 g/dL (ref 13.0–17.0)
MCH: 30.3 pg (ref 26.0–34.0)
MCHC: 34.4 g/dL (ref 30.0–36.0)
MCV: 88 fL (ref 80.0–100.0)
NRBC: 0 % (ref 0.0–0.2)
PLATELETS: 196 10*3/uL (ref 150–400)
RBC: 4.99 MIL/uL (ref 4.22–5.81)
RDW: 12.2 % (ref 11.5–15.5)
WBC: 6.7 10*3/uL (ref 4.0–10.5)

## 2018-05-09 LAB — COMPREHENSIVE METABOLIC PANEL
ALK PHOS: 85 U/L (ref 38–126)
ALT: 1105 U/L — AB (ref 0–44)
ANION GAP: 11 (ref 5–15)
AST: 137 U/L — ABNORMAL HIGH (ref 15–41)
Albumin: 3.5 g/dL (ref 3.5–5.0)
BUN: 8 mg/dL (ref 6–20)
CALCIUM: 9.4 mg/dL (ref 8.9–10.3)
CO2: 23 mmol/L (ref 22–32)
Chloride: 105 mmol/L (ref 98–111)
Creatinine, Ser: 0.92 mg/dL (ref 0.61–1.24)
Glucose, Bld: 106 mg/dL — ABNORMAL HIGH (ref 70–99)
Potassium: 3.5 mmol/L (ref 3.5–5.1)
SODIUM: 139 mmol/L (ref 135–145)
TOTAL PROTEIN: 6.8 g/dL (ref 6.5–8.1)
Total Bilirubin: 1.2 mg/dL (ref 0.3–1.2)

## 2018-05-09 MED ORDER — GUAIFENESIN-DM 100-10 MG/5ML PO SYRP: 5.0000 mL | ORAL_SOLUTION | ORAL | 0 refills | Status: DC | PRN

## 2018-05-09 MED ORDER — ALBUTEROL SULFATE HFA 108 (90 BASE) MCG/ACT IN AERS: 1.0000 | INHALATION_SPRAY | Freq: Four times a day (QID) | RESPIRATORY_TRACT | 2 refills | Status: DC | PRN

## 2018-05-09 MED ORDER — ESCITALOPRAM OXALATE 10 MG PO TABS: 10.0000 mg | ORAL_TABLET | Freq: Every day | ORAL | 0 refills | Status: DC

## 2018-05-09 MED ORDER — GABAPENTIN 100 MG PO CAPS: 100.0000 mg | ORAL_CAPSULE | Freq: Three times a day (TID) | ORAL | 0 refills | Status: DC

## 2018-05-09 MED ORDER — BENZONATATE 100 MG PO CAPS: 100.0000 mg | ORAL_CAPSULE | Freq: Three times a day (TID) | ORAL | 0 refills | Status: DC | PRN

## 2018-05-09 MED ORDER — AMOXICILLIN-POT CLAVULANATE 875-125 MG PO TABS: 1.0000 | ORAL_TABLET | Freq: Two times a day (BID) | ORAL | 0 refills | Status: AC

## 2018-05-09 NOTE — Progress Notes (Signed)
Patient discharged home per MD. Social work consult requested prior to discharge to provide resources for substance abuse. Discharge instructions provided to patient and parents. Patient to follow up with PCP. Scripts provided. Reinforced importance of taking complete course of antibiotics. Patient walked out accompanied by his father. Bess Kinds, RN

## 2018-05-09 NOTE — Discharge Summary (Addendum)
Physician Discharge Summary   Patient ID: Roberto Sims MRN: [id number] DOB/AGE: 11/06/1995 22 y.o.  Admit date: 05/05/2018 Discharge date: 05/09/2018  Primary Care Physician:  Ronnald Nian, MD   Recommendations for Outpatient Follow-up:  1. Follow up with PCP in 1-2 weeks 2. Please check LFTs in 1 week to ensure resolution of the transaminitis 3. Strongly recommend outpatient alcohol rehab program or therapist for addiction program  Home Health: None  Equipment/Devices:   Discharge Condition: stable  CODE STATUS: FULL  Diet recommendation: Regular diet   Discharge Diagnoses:    . Acute respiratory failure with hypoxia and hypercapnia (HCC) . Overdose, accidental or unintentional . Anxiety . Aspiration pneumonia (HCC) . Acute liver failure/acute transaminitis due to drug overdose Acute metabolic encephalopathy secondary to accidental overdose Acute kidney injury  Consults: Patient was admitted by CCM, Psychiatry    Allergies:  No Known Allergies   DISCHARGE MEDICATIONS: Allergies as of 05/09/2018   No Known Allergies     Medication List    STOP taking these medications   alprazolam 2 MG tablet Commonly known as:  XANAX     TAKE these medications   albuterol 108 (90 Base) MCG/ACT inhaler Commonly known as:  PROVENTIL HFA;VENTOLIN HFA Inhale 1-2 puffs into the lungs every 6 (six) hours as needed for wheezing or shortness of breath.   amoxicillin-clavulanate 875-125 MG tablet Commonly known as:  AUGMENTIN Take 1 tablet by mouth 2 (two) times daily for 5 days.   benzonatate 100 MG capsule Commonly known as:  TESSALON Take 1 capsule (100 mg total) by mouth 3 (three) times daily as needed for cough.   escitalopram 10 MG tablet Commonly known as:  LEXAPRO Take 1 tablet (10 mg total) by mouth daily.   gabapentin 100 MG capsule Commonly known as:  NEURONTIN Take 1 capsule (100 mg total) by mouth 3 (three) times daily.    guaiFENesin-dextromethorphan 100-10 MG/5ML syrup Commonly known as:  ROBITUSSIN DM Take 5 mLs by mouth every 4 (four) hours as needed for cough (over the counter).        Brief H and P: For complete details please refer to admission H and P, but in brief *Patient is a 22 year old male with known history of substance abuse, Xanax and alcohol last seen normal by girlfriend around 3 AM on the night of admission, found obtunded around 7 AM and brought by EMS, intubated in ED for airway protection. Patient was admitted to ICU by CCM.  Alcohol level 50, urine toxicology positive for cocaine and benzodiazepines. Now extubated, patient was transferred to hospitalist service, assumed care on 10/25   Hospital Course:  Acute respiratory failure with hypercapnia and hypoxemia -Likely due to drug overdose and inability to protect airway -Currently resolved, O2 sats 92% on room air.  Possible aspiration pneumonia -Chest x-ray 10/24 showed patchy peribronchial airspace opacities in the right lower lobe may represent aspiration pneumonitis -Patient was initially placed on IV Unasyn, transitioned to oral Augmentin for 7 days.   -Placed on [employer], Robitussin, continue incentive spirometry -Urine strep antigen negative  Acute kidney injury Likely due to overdose, #1, presented with creatinine of 2.3 Patient was placed on IV fluid hydration, resolved, creatinine 0.92  Acute metabolic encephalopathy sec to Accidental overdose -Per patient, he was partying with his friends prior to admission, acknowledges and admits that he had taken Xanax, alcohol with cocaine -He has history of anxiety, wants to talk to psychiatry for resources and outpatient help with his addiction.  Denied any SI or HI -Psychiatry consult was placed and patient was seen by psychiatrist, recommended gabapentin for alcohol dependence and Lexapro 10 mg daily.  He was also recommended outpatient and inpatient resources for  alcohol/drug rehab.  Acute transaminitis/ liver failure due to drug overdose  Likely due to cocaine and overdose Right upper quadrant ultrasound negative for any acute issues LFTs now trending down, hepatitis panel negative AST 137 down from 2481, ALT 1105 down from 3387 on 10/22 No symptoms of nausea vomiting or jaundice.  Total bilirubin 1.2, alkaline phosphatase 85 at the time of discharge. Strongly recommend follow-up LFTs in 1 week.  Day of Discharge S: Denies any specific complaints, hoping to go home today.  BP 100/67 (BP Location: Right Arm)   Pulse 75   Temp 98.2 F (36.8 C) (Oral)   Resp 16   Ht 6' (1.829 m)   Wt 94.3 kg   SpO2 92%   BMI 28.20 kg/m   Physical Exam: General: Alert and awake oriented x3 not in any acute distress. HEENT: anicteric sclera, pupils reactive to light and accommodation CVS: S1-S2 clear no murmur rubs or gallops Chest: clear to auscultation bilaterally, no wheezing rales or rhonchi Abdomen: soft nontender, nondistended, normal bowel sounds Extremities: no cyanosis, clubbing or edema noted bilaterally Neuro: Cranial nerves II-XII intact, no focal neurological deficits   The results of significant diagnostics from this hospitalization (including imaging, microbiology, ancillary and laboratory) are listed below for reference.      Procedures/Studies:  Dg Chest 1 View  Result Date: 05/05/2018 CLINICAL DATA:  Intubation. EXAM: CHEST  1 VIEW COMPARISON:  04/15/2018 FINDINGS: Endotracheal tube tip at the clavicular heads. An orogastric tube and side-port reaches the stomach. Low volume chest with streaky left perihilar density. Likely normal heart size when accounting for accentuation by low volumes. IMPRESSION: 1. Unremarkable hardware positioning. 2. Low volume chest with left perihilar atelectasis. Electronically Signed   By: Marnee Spring M.D.   On: 05/05/2018 09:58   Dg Chest 2 View  Result Date: 05/07/2018 CLINICAL DATA:  Aspiration  with cough and congestion. EXAM: CHEST - 2 VIEW COMPARISON:  05/06/2018 FINDINGS: Cardiomediastinal silhouette is normal. Mediastinal contours appear intact. There is no evidence of pleural effusion or pneumothorax. Patchy peribronchial airspace opacities in the right lower lobe. Osseous structures are without acute abnormality. Soft tissues are grossly normal. IMPRESSION: Patchy peribronchial airspace opacities in the right lower lobe may represent aspiration pneumonitis. Electronically Signed   By: Ted Mcalpine M.D.   On: 05/07/2018 12:35   Dg Chest 2 View  Result Date: 04/15/2018 CLINICAL DATA:  Cough over the last week, worsening recently. Productive. Fever. EXAM: CHEST - 2 VIEW COMPARISON:  None. FINDINGS: Patchy bronchopneumonia in the right perihilar region and mid lung. No dense consolidation or lobar collapse. The remainder the chest appears clear. No effusions. Heart and mediastinal shadows are normal. IMPRESSION: Patchy bronchopneumonia in the right mid lung and perihilar region. Electronically Signed   By: Paulina Fusi M.D.   On: 04/15/2018 13:38   Ct Head Wo Contrast  Result Date: 05/05/2018 CLINICAL DATA:  Altered level of consciousness, unresponsive, intubated EXAM: CT HEAD WITHOUT CONTRAST TECHNIQUE: Contiguous axial images were obtained from the base of the skull through the vertex without intravenous contrast. COMPARISON:  None. FINDINGS: Brain: The ventricular system is normal in size and configuration and the septum is in a normal midline position. The fourth ventricle and basilar cisterns are unremarkable. No hemorrhage, mass lesion, or acute infarction  is seen. Vascular: No vascular abnormality is evident on this unenhanced study. Skull: On bone window images, no calvarial abnormality is seen. Sinuses/Orbits: There is some mucosal thickening throughout the ethmoid air cells and within the maxillary sinuses but no air-fluid level is seen. Other: None IMPRESSION: 1. No acute  intracranial abnormality. 2. Mild mucosal thickening in the ethmoid air cells and maxillary sinuses. No air-fluid level. Electronically Signed   By: Dwyane Dee M.D.   On: 05/05/2018 10:41   Dg Chest Port 1 View  Result Date: 05/06/2018 CLINICAL DATA:  Acute respiratory failure EXAM: PORTABLE CHEST 1 VIEW COMPARISON:  05/05/2018 FINDINGS: Endotracheal tube tip 4.6 cm above the carina. Nasogastric tube enters the stomach. Low lung volumes are present, causing crowding of the pulmonary vasculature. Upper normal heart size. Mildly indistinct pulmonary vasculature on today's semi erect examination. No overt airspace opacity or pneumothorax. The left perihilar atelectasis shown on the prior exam is slightly less conspicuous today. IMPRESSION: 1. Satisfactorily positioned endotracheal and nasogastric tubes. 2. Low lung volumes are present, causing crowding of the pulmonary vasculature. 3. Improvement in the left perihilar atelectasis shown on the prior exam. Electronically Signed   By: Gaylyn Rong M.D.   On: 05/06/2018 10:25   US Abdomen Limited Ruq  Result Date: 05/07/2018 CLINICAL DATA:  Elevated LFTs EXAM: ULTRASOUND ABDOMEN LIMITED RIGHT UPPER QUADRANT COMPARISON:  None. FINDINGS: Gallbladder: A stone near the neck of the gallbladder is not excluded. No wall thickening, pericholecystic fluid, or Murphy's sign. Common bile duct: Diameter: 1.8 mm Liver: No focal lesion identified. Within normal limits in parenchymal echogenicity. Portal vein is patent on color Doppler imaging with normal direction of blood flow towards the liver. IMPRESSION: A stone near the neck of the gallbladder is not excluded on provided images. No wall thickening, pericholecystic fluid, or Murphy's sign. No other abnormalities. Electronically Signed   By: Gerome Sam III M.D   On: 05/07/2018 17:07       LAB RESULTS: Basic Metabolic Panel: Recent Labs  Lab 05/05/18 1331  05/08/18 0656 05/09/18 0635  NA 141   < >  139 139  K 3.8   < > 3.7 3.5  CL 110   < > 107 105  CO2 22   < > 22 23  GLUCOSE 111*   < > 109* 106*  BUN 12   < > 5* 8  CREATININE 1.44*   < > 0.97 0.92  CALCIUM 7.8*   < > 9.1 9.4  MG 2.0  --   --   --   PHOS 3.7  --   --   --    < > = values in this interval not displayed.   Liver Function Tests: Recent Labs  Lab 05/08/18 0656 05/09/18 0635  AST 277* 137*  ALT 1,561* 1,105*  ALKPHOS 85 85  BILITOT 1.6* 1.2  PROT 6.4* 6.8  ALBUMIN 3.4* 3.5   Recent Labs  Lab 05/05/18 1331  LIPASE 29  AMYLASE 1,008*   No results for input(s): AMMONIA in the last 168 hours. CBC: Recent Labs  Lab 05/05/18 0914  05/08/18 0656 05/09/18 0635  WBC 20.8*   < > 7.3 6.7  NEUTROABS 16.0*  --   --   --   HGB 15.8   < > 14.3 15.1  HCT 51.1   < > 42.5 43.9  MCV 94.6   < > 88.2 88.0  PLT 332   < > 180 196   < > = values  in this interval not displayed.   Cardiac Enzymes: Recent Labs  Lab 05/05/18 1040 05/05/18 1331  CKTOTAL 153 179   BNP: Invalid input(s): POCBNP CBG: Recent Labs  Lab 05/05/18 1252 05/05/18 1552  GLUCAP 127* 78      Disposition and Follow-up: Discharge Instructions    Diet - low sodium heart healthy   Complete by:  As directed    Increase activity slowly   Complete by:  As directed        DISPOSITION:home    DISCHARGE FOLLOW-UP Follow-up Information    Ronnald Nian, MD. Schedule an appointment as soon as possible for a visit in 2 week(s).   Specialty:  Family Medicine Contact information: [address] [place] [address] [phone]            Time coordinating discharge:  35 minutes  Signed:   Thad Ranger M.D. [facility] 05/09/2018, 12:07 PM Pager: [phone]

## 2018-05-09 NOTE — Progress Notes (Signed)
Clinical Social Worker received consult to assist patient with substance abuse treatment. CSW met patient and patients father at bedside. CSW gave patient resource for both inpatient and outpatient substance abuse rehab in the surrounding area. Patient father had questions about patient medical stay coverage. CSW encouraged him to contact billing Monday morning to work out a payment plan with the hospital. Contact information was given to patients father. CSW signing off as patients social work needs have been met.    Rhea Pink, MSW,  [place]

## 2018-05-10 LAB — CULTURE, BLOOD (ROUTINE X 2)
Culture: NO GROWTH
Culture: NO GROWTH

## 2018-05-11 ENCOUNTER — Telehealth: Payer: Self-pay | Admitting: Family Medicine

## 2018-05-11 NOTE — Telephone Encounter (Signed)
Tried to call pt. Dr. Susann Givens would like to see him. Pt did not answer and his voice mail was full so unable to leave a message. Sending this back to [facility] so he will be aware.

## 2018-05-11 NOTE — Telephone Encounter (Signed)
Pt called and made an appt for tomorrow.

## 2018-05-12 ENCOUNTER — Ambulatory Visit (INDEPENDENT_AMBULATORY_CARE_PROVIDER_SITE_OTHER): Payer: Self-pay | Admitting: Family Medicine

## 2018-05-12 ENCOUNTER — Encounter: Payer: Self-pay | Admitting: Family Medicine

## 2018-05-12 VITALS — BP 104/68 | HR 57 | Temp 97.8°F | Ht 69.0 in | Wt 187.6 lb

## 2018-05-12 DIAGNOSIS — R740 Nonspecific elevation of levels of transaminase and lactic acid dehydrogenase [LDH]: Secondary | ICD-10-CM

## 2018-05-12 DIAGNOSIS — R7401 Elevation of levels of liver transaminase levels: Secondary | ICD-10-CM

## 2018-05-12 DIAGNOSIS — T50901A Poisoning by unspecified drugs, medicaments and biological substances, accidental (unintentional), initial encounter: Secondary | ICD-10-CM

## 2018-05-12 DIAGNOSIS — F1994 Other psychoactive substance use, unspecified with psychoactive substance-induced mood disorder: Secondary | ICD-10-CM

## 2018-05-12 DIAGNOSIS — J690 Pneumonitis due to inhalation of food and vomit: Secondary | ICD-10-CM

## 2018-05-12 NOTE — Progress Notes (Signed)
   Subjective:    Patient ID: Roberto Sims, male    DOB: [birth year], 22 y.o.   MRN: [id number]  HPI He is here for follow-up visit after recent hospitalization for drug overdose.  Urine toxicology was positive for benzodiazepines, cocaine and marijuana.  The blood work also showed transaminitis.  He did have acute respiratory failure with hypoxia and hypercapnia as well as some evidence of aspiration pneumonia.  He was taken care of appropriately and was discharged on 10/26.  He is here for follow-up concerning his liver enzymes. He has a history of drug abuse for several years and 3 years ago he did go through a rehab program however he admits that he only did it because of his parents insistence.  He does state that now he plans to get involved in a rehab program as well as go to AA.  He and his mother have been looking into programs and have found one in [place] which is where his parents live.   Review of Systems     Objective:   Physical Exam Alert and in no distress otherwise not examined       Assessment & Plan:  Substance induced mood disorder (HCC) - Plan: CBC with Differential/Platelet, Comprehensive metabolic panel  Transaminitis - Plan: CBC with Differential/Platelet, Comprehensive metabolic panel  Aspiration pneumonia, unspecified aspiration pneumonia type, unspecified laterality, unspecified part of lung (HCC) - Plan: CBC with Differential/Platelet, Comprehensive metabolic panel  Overdose, accidental or unintentional, initial encounter I discussed the drug overdose with him.  I explained that he came close to dying from the overdose  He seems to have understood this and does plan to make permanent changes.  He plans to get involved in AA as well as in counseling.   [facility] given [facility] and perhaps [facility].  I explained that I can help with this but he would need to be followed up by psychiatry as well as get  involved in counseling.

## 2018-05-12 NOTE — Patient Instructions (Addendum)
Checked out the regular center.  Also Dr. Betti Cruz.  Checkout [facility]

## 2018-05-13 LAB — COMPREHENSIVE METABOLIC PANEL
A/G RATIO: 1.5 (ref 1.2–2.2)
ALK PHOS: 93 IU/L (ref 39–117)
ALT: 502 IU/L — AB (ref 0–44)
AST: 51 IU/L — AB (ref 0–40)
Albumin: 4.6 g/dL (ref 3.5–5.5)
BUN/Creatinine Ratio: 13 (ref 9–20)
BUN: 12 mg/dL (ref 6–20)
Bilirubin Total: 0.6 mg/dL (ref 0.0–1.2)
CO2: 20 mmol/L (ref 20–29)
Calcium: 10.1 mg/dL (ref 8.7–10.2)
Chloride: 102 mmol/L (ref 96–106)
Creatinine, Ser: 0.91 mg/dL (ref 0.76–1.27)
GFR calc Af Amer: 137 mL/min/{1.73_m2} (ref >59)
GFR, EST NON AFRICAN AMERICAN: 118 mL/min/{1.73_m2} (ref >59)
Globulin, Total: 3 g/dL (ref 1.5–4.5)
Glucose: 101 mg/dL — ABNORMAL HIGH (ref 65–99)
POTASSIUM: 4.3 mmol/L (ref 3.5–5.2)
SODIUM: 139 mmol/L (ref 134–144)
Total Protein: 7.6 g/dL (ref 6.0–8.5)

## 2018-05-13 LAB — CBC WITH DIFFERENTIAL/PLATELET
Basophils Absolute: 0.1 10*3/uL (ref 0.0–0.2)
Basos: 1 %
EOS (ABSOLUTE): 0.4 10*3/uL (ref 0.0–0.4)
Eos: 5 %
Hematocrit: 45.4 % (ref 37.5–51.0)
Hemoglobin: 15.9 g/dL (ref 13.0–17.7)
Immature Grans (Abs): 0.1 10*3/uL (ref 0.0–0.1)
Immature Granulocytes: 2 %
LYMPHS ABS: 2.6 10*3/uL (ref 0.7–3.1)
Lymphs: 32 %
MCH: 31 pg (ref 26.6–33.0)
MCHC: 35 g/dL (ref 31.5–35.7)
MCV: 89 fL (ref 79–97)
MONOS ABS: 0.6 10*3/uL (ref 0.1–0.9)
Monocytes: 7 %
NEUTROS PCT: 53 %
Neutrophils Absolute: 4.5 10*3/uL (ref 1.4–7.0)
PLATELETS: 244 10*3/uL (ref 150–450)
RBC: 5.13 x10E6/uL (ref 4.14–5.80)
RDW: 13 % (ref 12.3–15.4)
WBC: 8.2 10*3/uL (ref 3.4–10.8)

## 2019-07-18 NOTE — Progress Notes (Signed)
 Patient ID:  Roberto Sims is a 24 y.o.  24-Sep-1995   male     Nasal Congestion, Fatigue, Headache, Fever, Cough, and Shortness of Breath  During this patient encounter, the patient was wearing a mask.  Throughout this encounter, I was wearing at least a surgical mask.  I was not within 6 feet of this patient for more than 15 minutes without eye protection when they were not wearing mask.    Patient presents with fever that started yesterday, cough and fatigue.  He has had some yellow-clear rhinorrhea and mild headache.  He reports some SOB with exertion.  He has also had bodyaches.  He denies nausea/vomiting/diarrhea, loss of smell/taste.  He has had positive exposure to COVID-19 but had a negative test a few days ago.  History given by patient  Accompanied by:   self/  family member/friend who is helping with history.  Review of Systems: All others reviewed and negative except as listed above.  Past Medical History, Past surgery History, Allergies, Social history, and Family History were reviewed and updated.   EXAM:  BP 116/74   Pulse 79   Temp 98.5 F (36.9 C) (Oral)   Ht 1.803 m (5' 11)   Wt 93 kg (205 lb)   SpO2 100%   BMI 28.59 kg/m      Constitutional:  Well-nourished well-developed in no apparent distress Neuro:    Alert and oriented x3 Psych:   Affect is normal Head:    Normocephalic, atraumatic, Oropharynx- moderately erythematous Neck:    Full range of motion, no lymphadenopathy CV:    Regular rate, Normal S1S2 RESP:   No respiratory distress, CTA ABD:   Non-Distended Musculoskeletal: FROM, Non-tender Skin:    No focal rashes, lesions, or ulcerations   Radiologist interpretation:    No results found for this or any previous visit (from the past 24 hour(s)). No image results found.   Lab Results: Recent Results (from the past 72 hour(s))  POCT INFLUENZA A/B  Result Value Ref Range   Rapid Influenza A Ag Negative Reference Range:  Negative   Rapid  Influenza B Ag Negative Reference Range:  Negative   QC VERIFIED AND ACCEPTABLE Yes    LOT NUMBER [id number]    Exp. Date 03 31 2022   POCT COVID BD  Result Value Ref Range   COVID Antigen, POC Positive (A) Negative   COVID Antigen Internal QC, POC Internal QC - Passed Internal QC - Passed   COVID KIT EXP DATE, POC [id number]    COVID KIT LOT NUM, POC [id number]     Lab work and/or xrays were reviewed and incorporated into the decision making process.    Impression/Plan 1. COVID-19 virus infection   2. Cough  - POCT COVID BD - POCT INFLUENZA A/B    Orders Placed This Encounter  Procedures  . POCT COVID BD  . POCT INFLUENZA A/B      Further recommendations based on pending labs if needed.     Patient has been instructed on medications, dosages, side effects, and possible interactions as associated with each diagnosis in my impression and plan above.  2.   Patient education (verbal/handout) given on diet diagnosis, pathophysiology, treatment of diagnosis, side effects of medication use for treatment, restrictions while taking medication,  supportive measures such as pushing fluids, using OTC medications,  3.  Patient was given AVS.  This was reviewed with them.  Red Flags associated with their diagnoses were reviewed and patient  was educated on what to do if red flags develop.  4.  Patient agreed with plan and voiced understanding.  NO barriers to adherence perceived by myself.  Electronically signed by: Grayce Marcellus Lee, PA-C 07/18/2019 3:02 PM     Electronically signed by: Grayce Marcellus Lee, PA-C 07/18/19 1529

## 2019-10-06 IMAGING — CT CT HEAD W/O CM
4 series · 16 of 47 positions shown, 18 images · non-contrast
Comparison: None.

CLINICAL DATA: Altered level of consciousness, unresponsive,
intubated

EXAM:
CT HEAD WITHOUT CONTRAST
TECHNIQUE: Contiguous axial images were obtained from the base of the skull
through the vertex without intravenous contrast.

[Series 3: head without · axial · non-contrast · 0.41mm/px · z∈[-91,+29]mm · 7 of 32 slices shown, 9 images]
[im 4/32  brain]
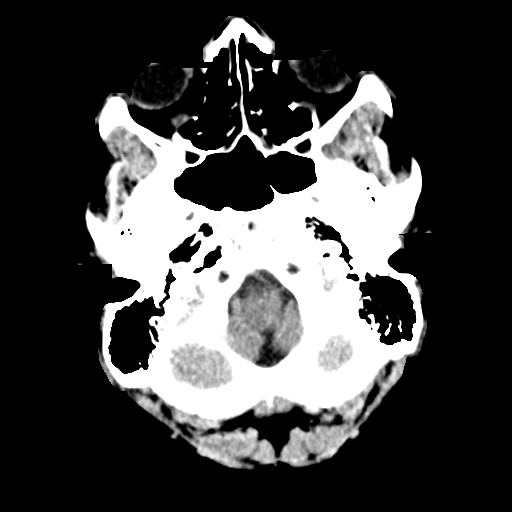
[im 4/32  bone]
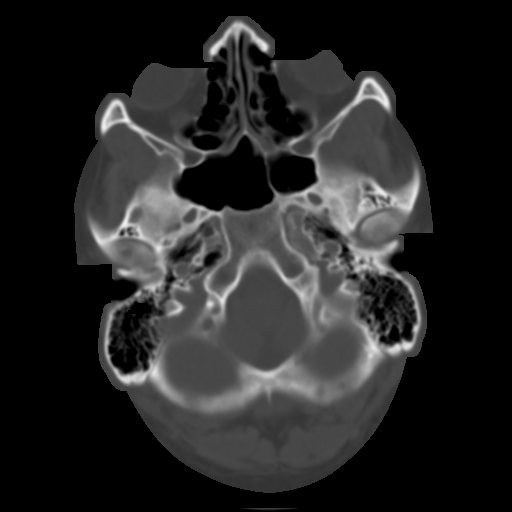
[im 8/32  brain]
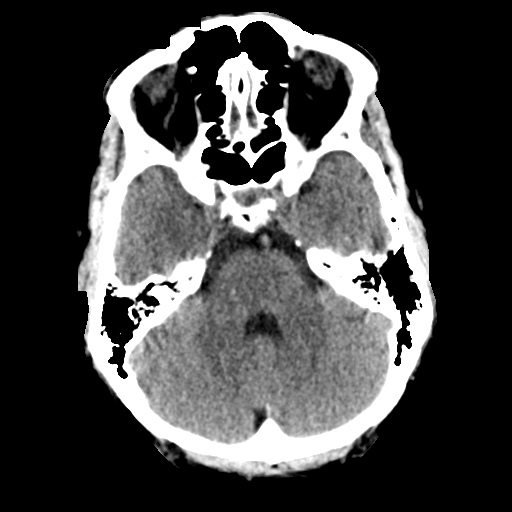
[im 12/32  brain]
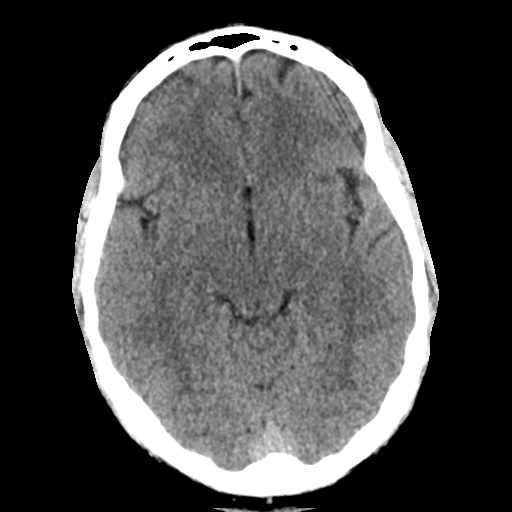
[im 16/32  brain]
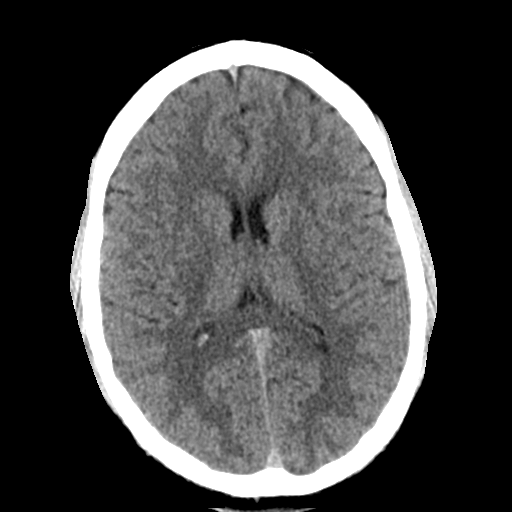
[im 20/32  brain]
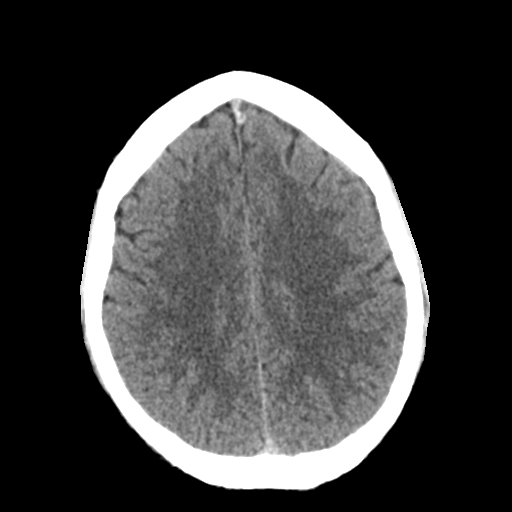
[im 20/32  bone]
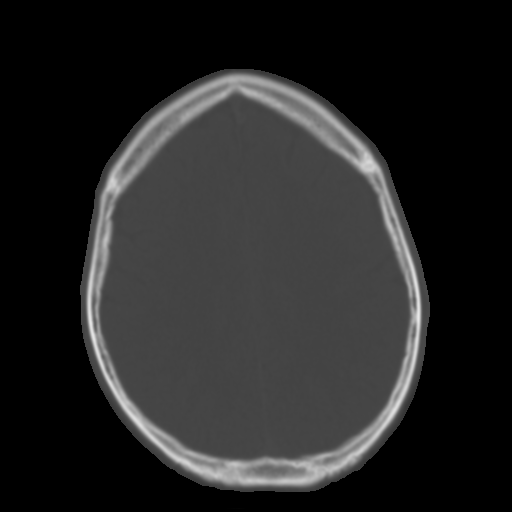
[im 24/32  brain]
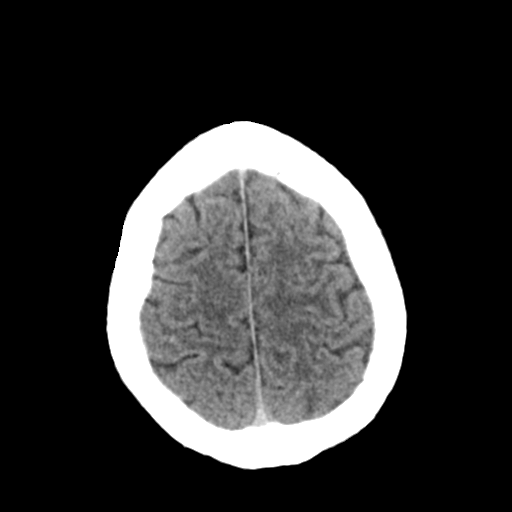
[im 28/32  brain]
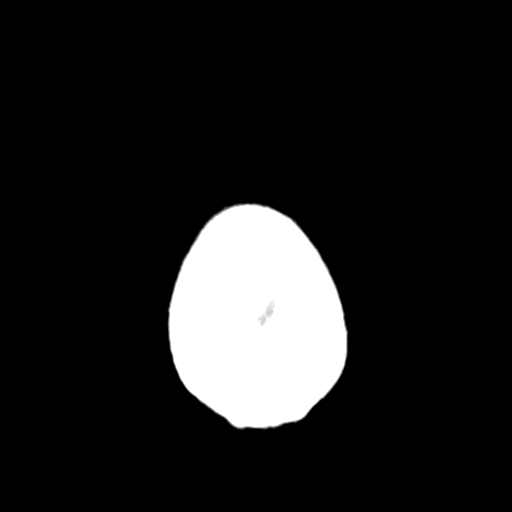

[Series 4: head bone · axial · 0.41mm/px · z∈[-92,-60]mm · 3 of 78 slices shown]
[im 8/78  bone]
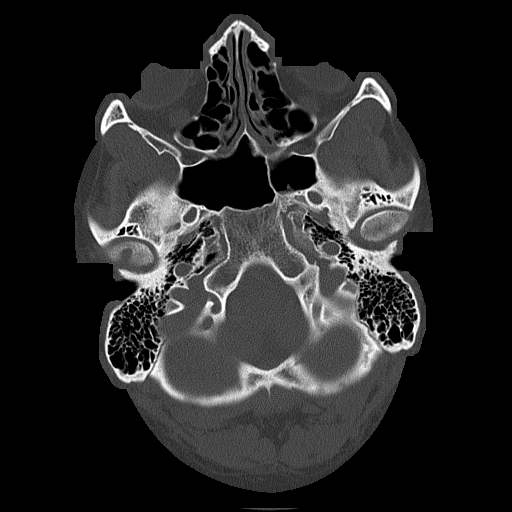
[im 16/78  bone]
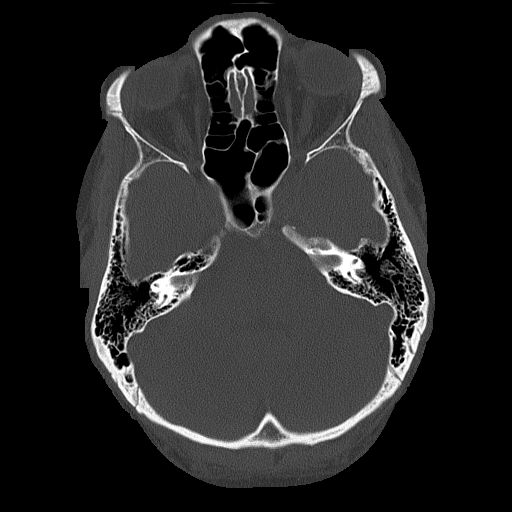
[im 24/78  bone]
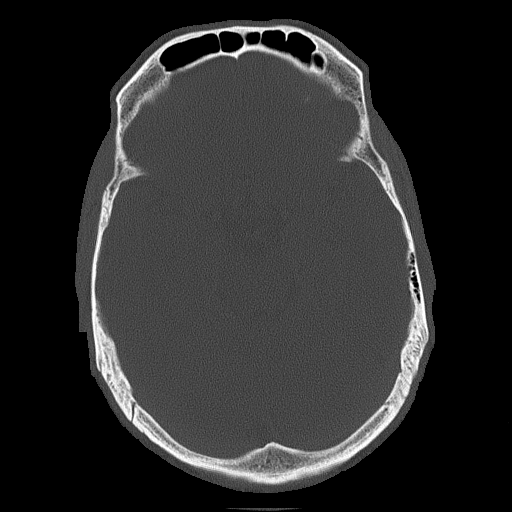

[Series 5: head without cor · coronal · non-contrast · 0.33mm/px · 3 of 69 slices shown]
[im 23/69  brain]
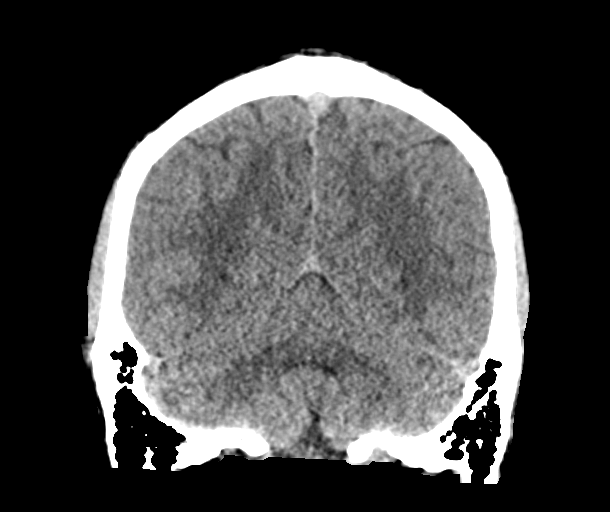
[im 31/69  brain]
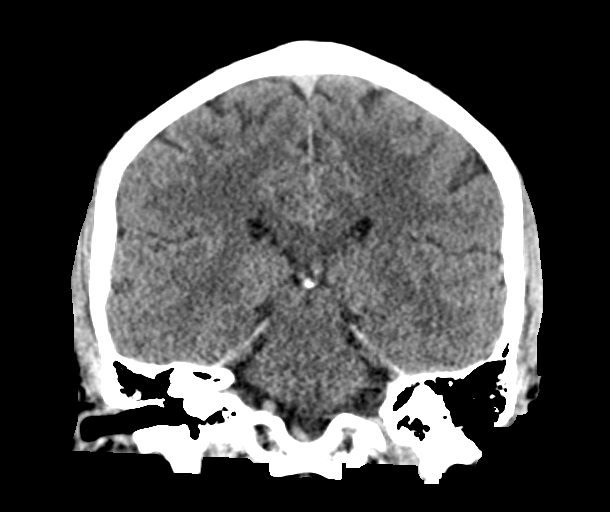
[im 38/69  brain]
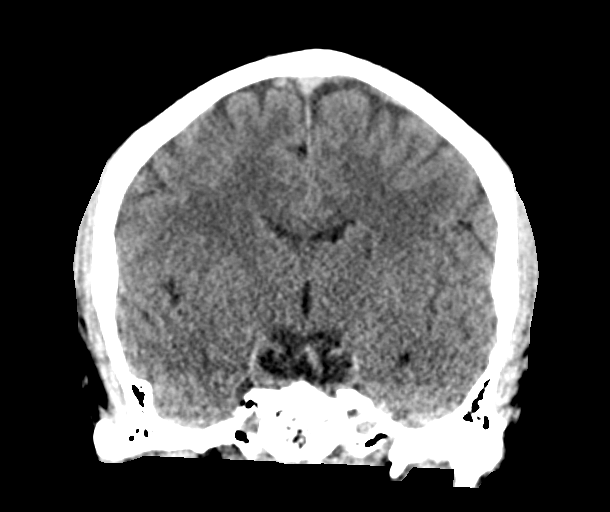

[Series 6: head without sag · sagittal · non-contrast · 0.31mm/px · 3 of 57 slices shown]
[im 19/57  brain]
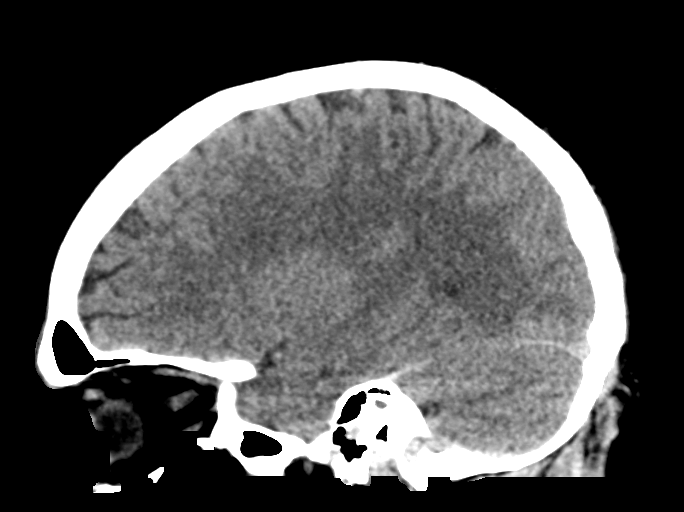
[im 29/57  brain]
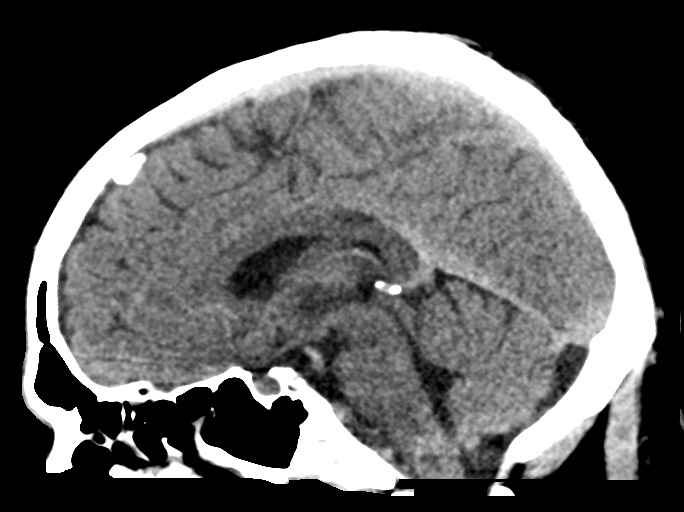
[im 38/57  brain]
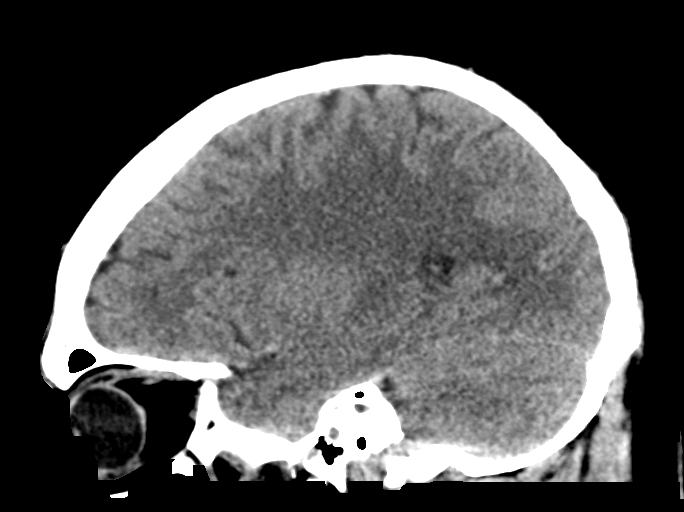

[16 of 47 positions shown; findings below may reference images not displayed]

FINDINGS: Brain: The ventricular system is normal in size and configuration
and the septum is in a normal midline position. The fourth ventricle
and basilar cisterns are unremarkable. No hemorrhage, mass lesion,
or acute infarction is seen.

Vascular: No vascular abnormality is evident on this unenhanced
study.

Skull: On bone window images, no calvarial abnormality is seen.

Sinuses/Orbits: There is some mucosal thickening throughout the
ethmoid air cells and within the maxillary sinuses but no air-fluid
level is seen.

Other: None
IMPRESSION: 1. No acute intracranial abnormality.
2. Mild mucosal thickening in the ethmoid air cells and maxillary
sinuses. No air-fluid level.

## 2019-10-07 IMAGING — DX DG CHEST 1V PORT
1 series · 1 of 1 positions shown · non-contrast
Comparison: 05/05/2018

CLINICAL DATA: Acute respiratory failure

EXAM:
PORTABLE CHEST 1 VIEW

[chest]
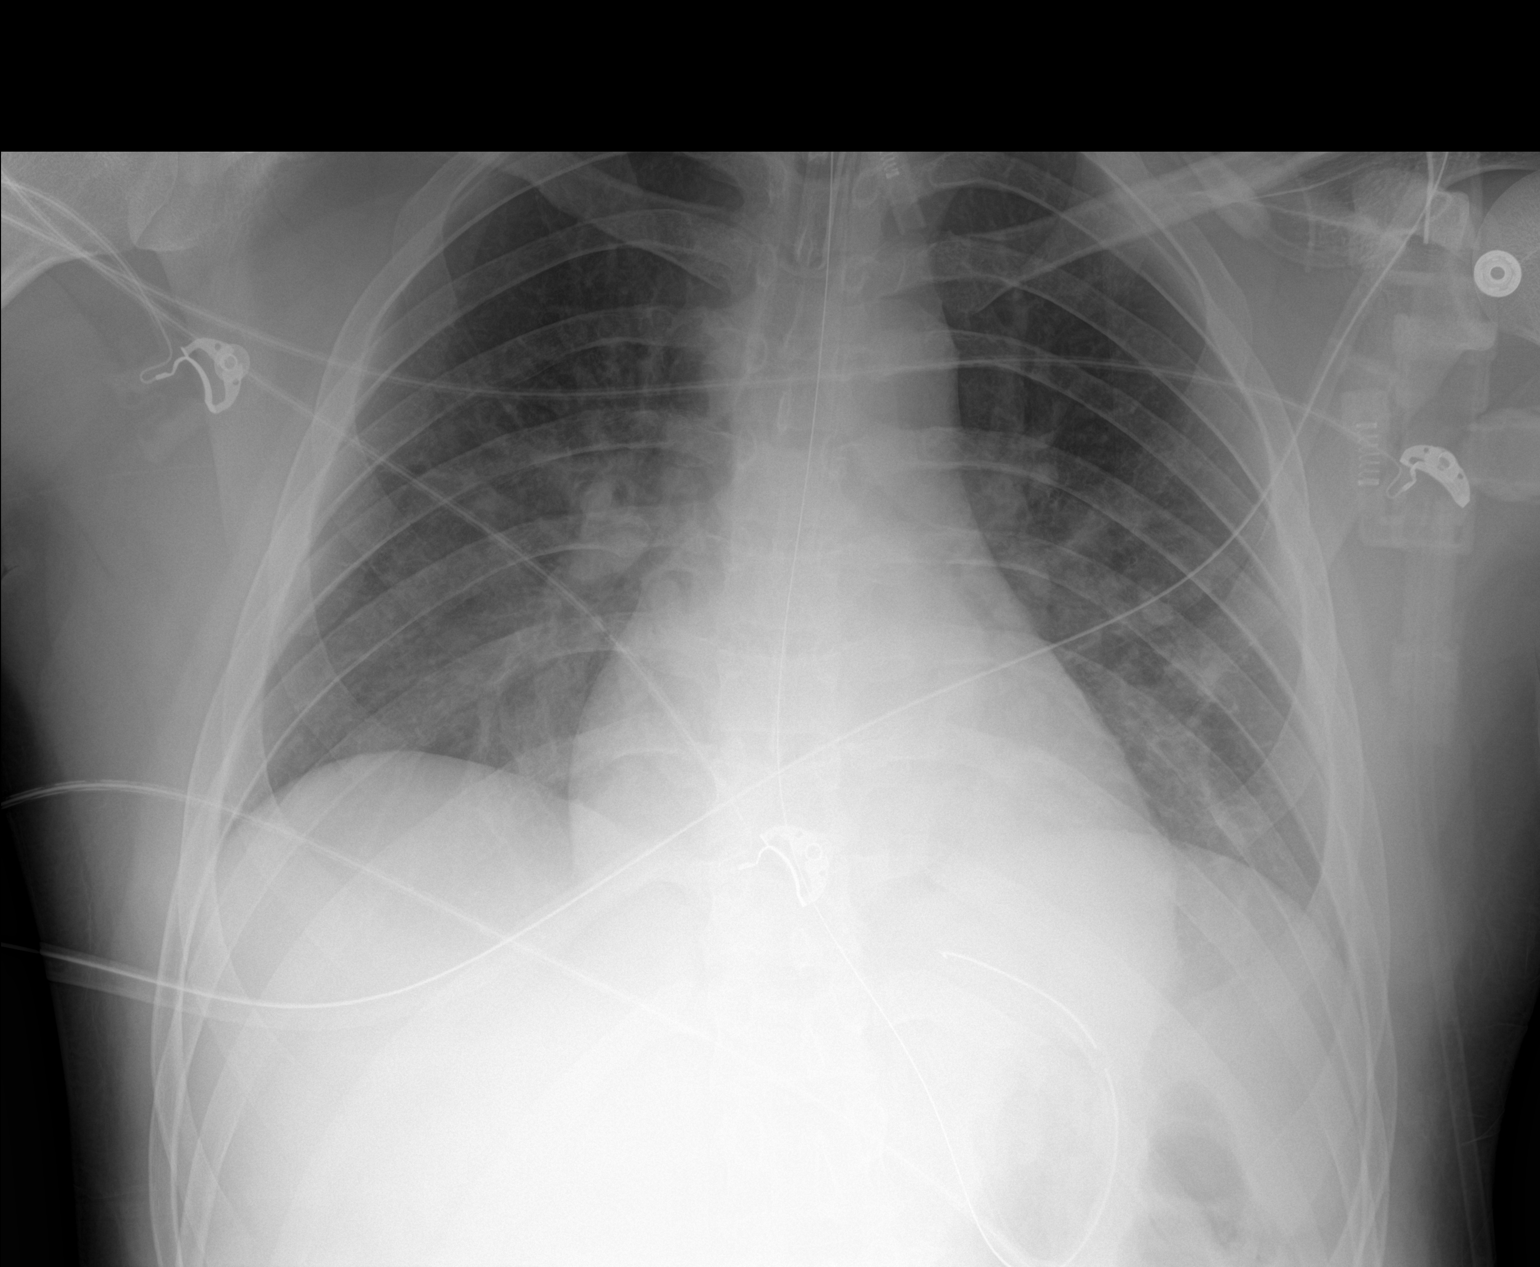

[1 of 1 positions shown; findings below may reference images not displayed]

FINDINGS: Endotracheal tube tip 4.6 cm above the carina. Nasogastric tube
enters the stomach.

Low lung volumes are present, causing crowding of the pulmonary
vasculature. Upper normal heart size. Mildly indistinct pulmonary
vasculature on today's semi erect examination. No overt airspace
opacity or pneumothorax. The left perihilar atelectasis shown on the
prior exam is slightly less conspicuous today.
IMPRESSION: 1. Satisfactorily positioned endotracheal and nasogastric tubes.
2. Low lung volumes are present, causing crowding of the pulmonary
vasculature.
3. Improvement in the left perihilar atelectasis shown on the prior
exam.

## 2019-10-08 IMAGING — CR DG CHEST 2V
2 series · 2 of 2 positions shown · non-contrast
Comparison: 05/06/2018

CLINICAL DATA: Aspiration with cough and congestion.

EXAM:
CHEST - 2 VIEW

[chest pa]
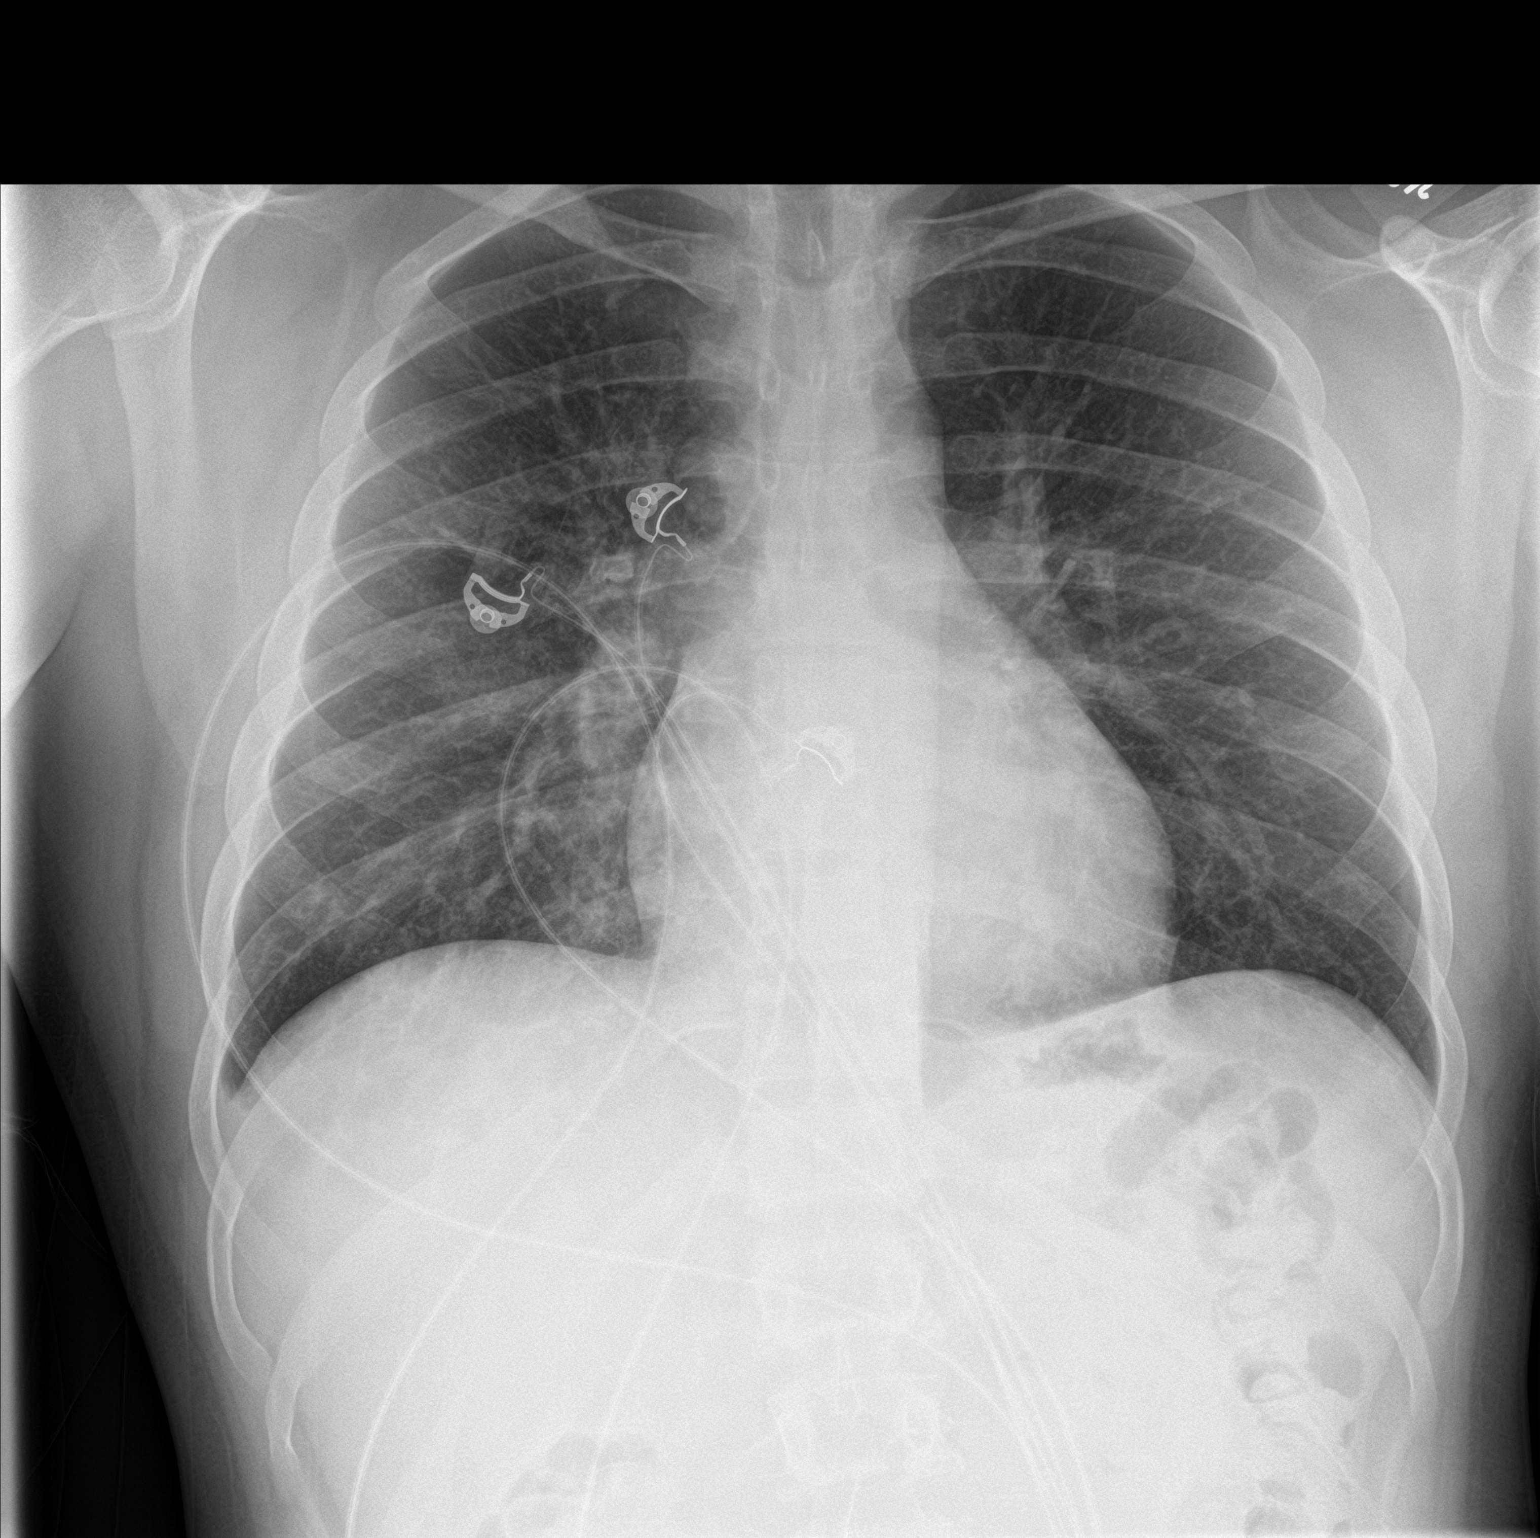

[chest lat]
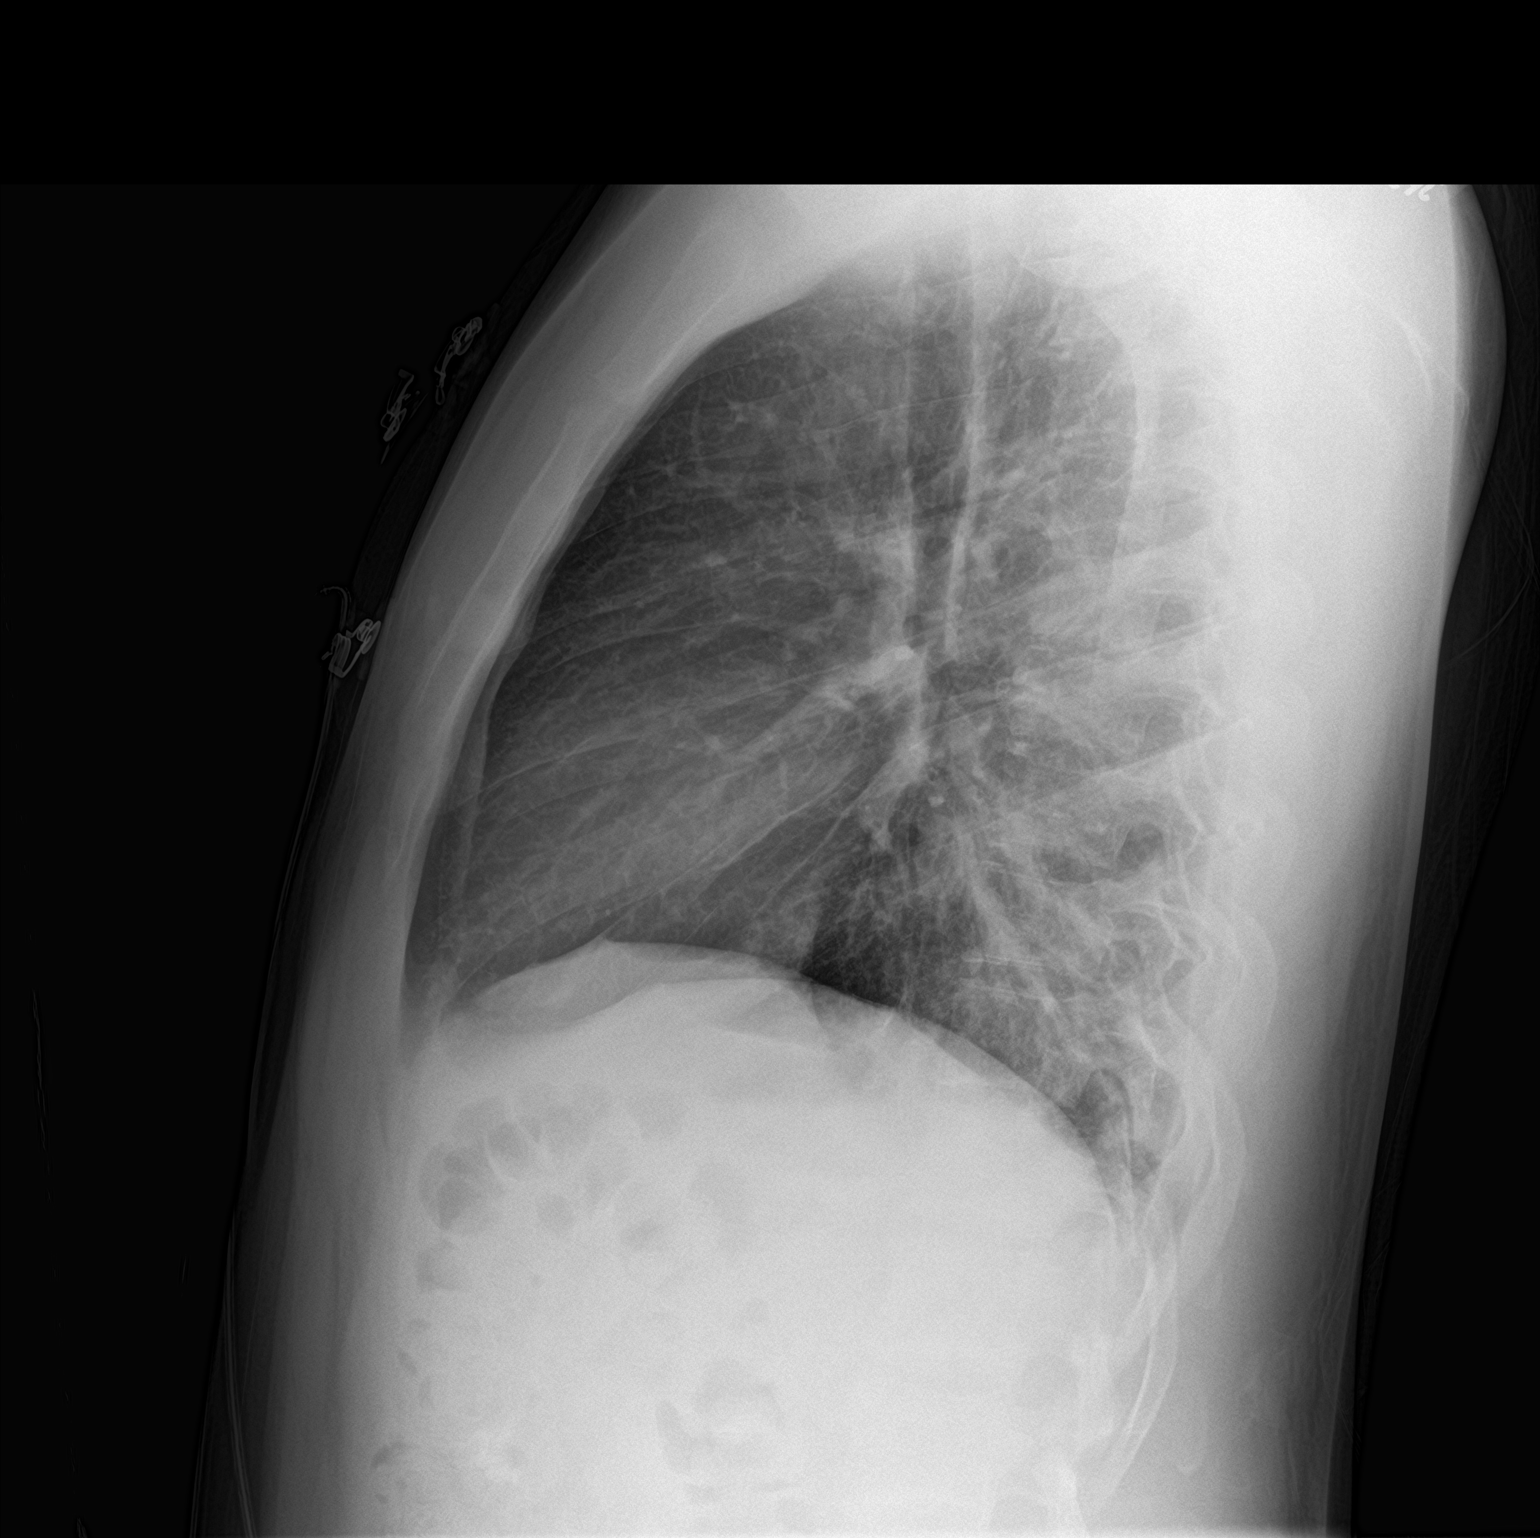

[2 of 2 positions shown; findings below may reference images not displayed]

FINDINGS: Cardiomediastinal silhouette is normal. Mediastinal contours appear
intact.

There is no evidence of pleural effusion or pneumothorax. Patchy
peribronchial airspace opacities in the right lower lobe.

Osseous structures are without acute abnormality. Soft tissues are
grossly normal.
IMPRESSION: Patchy peribronchial airspace opacities in the right lower lobe may
represent aspiration pneumonitis.

## 2021-01-23 ENCOUNTER — Other Ambulatory Visit: Payer: No Typology Code available for payment source

## 2023-05-19 ENCOUNTER — Other Ambulatory Visit: Payer: Self-pay

## 2023-10-09 NOTE — Progress Notes (Signed)
 Subjective Patient ID: Roberto Sims is a 28 y.o. male.  Chief Complaint  Patient presents with  . Nasal Congestion  . Cough  . Fatigue  . Sore Throat    Patient reports cough, congestion, sore throat, fatigue, and suspected fever for the past four days.   . Fever    The following information was reviewed by members of the visit team:  Tobacco  Allergies  Meds  Med Hx  Surg Hx  Fam Hx  Soc Hx     28 yo M who presents for evaluation of nasal congestion, cough, fatigue and sore throat which began 3 days ago. No fever but reports chills and body aches. Cough has been productive of sputum. No associated shortness of breath, wheezing or chest tightness. No nausea, vomiting or diarrhea. No known sick contacts.   Cough Associated symptoms include a fever, myalgias and a sore throat. Pertinent negatives include no chest pain, ear pain, headaches, rash, rhinorrhea, shortness of breath or wheezing.  Fatigue Associated symptoms include congestion, coughing, fatigue, a fever, myalgias and a sore throat. Pertinent negatives include no abdominal pain, arthralgias, chest pain, headaches, nausea, rash or vomiting.  Sore Throat  Associated symptoms include congestion and coughing. Pertinent negatives include no abdominal pain, diarrhea, ear pain, headaches, shortness of breath or vomiting.  Fever  Associated symptoms include congestion, coughing and a sore throat. Pertinent negatives include no abdominal pain, chest pain, diarrhea, ear pain, headaches, nausea, rash, vomiting or wheezing.    Review of Systems  Constitutional:  Positive for fatigue and fever.  HENT:  Positive for congestion and sore throat. Negative for ear pain, rhinorrhea, sinus pressure and sinus pain.   Respiratory:  Positive for cough. Negative for chest tightness, shortness of breath and wheezing.   Cardiovascular:  Negative for chest pain.  Gastrointestinal:  Negative for abdominal pain, diarrhea, nausea and  vomiting.  Musculoskeletal:  Positive for myalgias. Negative for arthralgias.  Skin:  Negative for rash.  Neurological:  Negative for dizziness and headaches.  Hematological:  Negative for adenopathy. Does not bruise/bleed easily.    Objective Physical Exam Vitals reviewed.  Constitutional:      General: He is not in acute distress.    Appearance: Normal appearance. He is not ill-appearing, toxic-appearing or diaphoretic.  HENT:     Right Ear: Tympanic membrane, ear canal and external ear normal.     Left Ear: Tympanic membrane, ear canal and external ear normal.     Nose: Congestion present.     Mouth/Throat:     Mouth: Mucous membranes are moist.     Pharynx: Posterior oropharyngeal erythema present. No oropharyngeal exudate.  Eyes:     Conjunctiva/sclera: Conjunctivae normal.  Cardiovascular:     Rate and Rhythm: Normal rate and regular rhythm.     Pulses: Normal pulses.  Pulmonary:     Effort: Pulmonary effort is normal. No respiratory distress.     Breath sounds: Normal breath sounds.  Musculoskeletal:     Cervical back: Normal range of motion and neck supple. No rigidity.  Skin:    General: Skin is warm and dry.     Findings: No rash.  Neurological:     Mental Status: He is alert and oriented to person, place, and time.  Psychiatric:        Mood and Affect: Mood normal.     Assessment/Plan 28 yo M with 3 day history of nasal congestion, cough, sore throat and fatigue POC Flu test is negative POC  Covid test is positive No sign of bacterial infection or other complication on physical exam.  Vitals wnl, patient appears to be in no acute distress.  Recommend rest, fluids. May use OTC ibuprofen  or tylenol  as needed for fever/bodyaches. Recommend sudafed, flonase for congestion, OTC cough medications for cough Follow with PCP or return to clinic as needed for persistent symptoms.  Urgent Care Disposition:  Home Care    Electronically signed: Franky Floria Finder, PA-C 10/09/2023  3:01 PM
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
## 2024-01-26 NOTE — Progress Notes (Signed)
  Subjective Patient ID: Roberto Sims is a 28 y.o. male.  Chief Complaint  Patient presents with  . Cough    Body aches, cough, congestion x 1 week. Had some n/v last week with an upset stomach.     The following information was reviewed by members of the visit team:  Tobacco  Allergies  Meds  Med Hx  Surg Hx  Fam Hx  Soc Hx     28 yo M who presents for evaluation of cough, congestion and body aches which began one week ago. Had nausea, vomiting and diarrhea at onset of symptoms which has resolved. Cough continues and is nonproductive. No associated shortness of breath or chest tightness. No recent fever, chills. Feels he has been improving somewhat but cough persists. Want to make sure he isn't contagious and that he is able to return to work. Has been using OTC medications for symptoms.     Review of Systems  Constitutional:  Negative for chills and fever.  HENT:  Positive for congestion and sinus pressure. Negative for ear pain, rhinorrhea, sinus pain and sore throat.   Respiratory:  Positive for cough. Negative for shortness of breath.   Cardiovascular:  Negative for chest pain.  Gastrointestinal:  Positive for diarrhea, nausea and vomiting. Negative for abdominal pain.  Musculoskeletal:  Negative for arthralgias and myalgias.  Skin:  Negative for rash.  Neurological:  Negative for dizziness and headaches.  Hematological:  Negative for adenopathy. Does not bruise/bleed easily.    Objective Physical Exam Vitals reviewed.  Constitutional:      General: He is not in acute distress.    Appearance: Normal appearance. He is not ill-appearing, toxic-appearing or diaphoretic.  HENT:     Right Ear: Tympanic membrane, ear canal and external ear normal.     Left Ear: Tympanic membrane, ear canal and external ear normal.     Nose: Nose normal. No congestion or rhinorrhea.     Mouth/Throat:     Mouth: Mucous membranes are moist.     Pharynx: No oropharyngeal exudate or  posterior oropharyngeal erythema.   Eyes:     Conjunctiva/sclera: Conjunctivae normal.    Cardiovascular:     Rate and Rhythm: Normal rate and regular rhythm.     Pulses: Normal pulses.  Pulmonary:     Effort: Pulmonary effort is normal. No respiratory distress.     Breath sounds: Normal breath sounds.   Musculoskeletal:     Cervical back: Normal range of motion.   Skin:    General: Skin is warm and dry.     Findings: No rash.   Neurological:     Mental Status: He is alert and oriented to person, place, and time.   Psychiatric:        Mood and Affect: Mood normal.     Assessment/Plan 28 yo M with one week history of cough, congestion Symptoms and exam most consistent with viral illness. No sign of bacterial infection on exam Vitals wnl, patient appears to be in no acute distress.  Given duration of symptoms, testing for flu/covid does not appear to be indicated though was offered to patient. He declines testing. Rx for phenergan dm due to persistent cough Recommend continue OTC medications for congestion as needed Follow up with PCP or return to clinic as needed for persistent symptoms.  Urgent Care Disposition:  Home Care    Electronically signed: Franky Floria Finder, PA-C 01/26/2024  1:04 PM

## 2024-03-23 NOTE — Progress Notes (Signed)
 Subjective Patient ID: Roberto Sims is a 28 y.o. male.  Chief Complaint  Patient presents with  . BL arm and leg numbness    B/L arm numbness from elbow to hand, B/L leg numbness from knee to foot, no injury noted    The following information was reviewed by members of the visit team:  Tobacco  Allergies  Med Hx      28 yo M who presents for evaluation of bilateral numbness/tingling in arms from elbows to hands and in lower legs from knees to feet. Symptoms have been intermittent over past several weeks but seemingly worse today. Denies associated pain or weakness in limbs. States he does feel cramping in hands and legs at times. No recent fever, chills, nausea, vomiting or diarrhea. He has been eating and drinking well. He did start taking propranolol  approximately 2 months ago but otherwise no new or different medications or supplements. Patient is concerned that he may be dehydrated. No associated headaches, dizziness, shortness of breath, chest discomfort or other complaints.     Review of Systems  Constitutional:  Negative for chills and fever.  Respiratory:  Negative for shortness of breath.   Cardiovascular:  Negative for chest pain and palpitations.  Gastrointestinal:  Negative for abdominal pain, diarrhea, nausea and vomiting.  Genitourinary:  Negative for dysuria, flank pain and frequency.  Musculoskeletal:  Negative for arthralgias, myalgias, neck pain and neck stiffness.  Skin:  Negative for rash.  Neurological:  Positive for numbness. Negative for dizziness and weakness.  Hematological:  Negative for adenopathy. Does not bruise/bleed easily.    Objective Physical Exam Vitals reviewed.  Constitutional:      General: He is not in acute distress.    Appearance: Normal appearance. He is not ill-appearing, toxic-appearing or diaphoretic.  HENT:     Right Ear: Tympanic membrane, ear canal and external ear normal.     Left Ear: Tympanic membrane, ear canal and  external ear normal.     Nose: Nose normal.     Mouth/Throat:     Mouth: Mucous membranes are moist.     Pharynx: No oropharyngeal exudate or posterior oropharyngeal erythema.  Eyes:     Extraocular Movements: Extraocular movements intact.     Conjunctiva/sclera: Conjunctivae normal.     Pupils: Pupils are equal, round, and reactive to light.  Cardiovascular:     Rate and Rhythm: Normal rate and regular rhythm.     Pulses: Normal pulses.     Heart sounds: Normal heart sounds.  Pulmonary:     Effort: Pulmonary effort is normal. No respiratory distress.  Musculoskeletal:        General: No swelling, tenderness, deformity or signs of injury. Normal range of motion.     Cervical back: Normal range of motion and neck supple. No rigidity.  Skin:    General: Skin is warm and dry.     Capillary Refill: Capillary refill takes less than 2 seconds.     Findings: No rash.  Neurological:     General: No focal deficit present.     Mental Status: He is alert and oriented to person, place, and time.     Cranial Nerves: No cranial nerve deficit.     Motor: No weakness.  Psychiatric:        Mood and Affect: Mood normal.     Assessment/Plan 28 yo M with several days of paresthesia to bilateral lower arms and lower legs.  POC CMP obtained to assess for dehydration or  electrolyte imbalance. Results are unremarkable Physical exam also unremarkable. There is no weakness or abnormality on neuro exam. Vitals wnl, patient in no acute distress Recommend follow up with PCP for further evaluation/management Go to ER if develop associated fever, weakness or other worsening symptoms.  Urgent Care Disposition:  Home Care    Electronically signed: Franky Floria Finder, PA-C 03/23/2024  3:12 PM

## 2024-03-28 ENCOUNTER — Emergency Department (HOSPITAL_COMMUNITY)

## 2024-03-28 ENCOUNTER — Other Ambulatory Visit: Payer: Self-pay

## 2024-03-28 ENCOUNTER — Observation Stay (HOSPITAL_BASED_OUTPATIENT_CLINIC_OR_DEPARTMENT_OTHER)
Admission: EM | Admit: 2024-03-28 | Discharge: 2024-03-29 | Disposition: A | Discharge status: Home / Self Care | Attending: Family Medicine | Admitting: Family Medicine

## 2024-03-28 ENCOUNTER — Encounter (HOSPITAL_BASED_OUTPATIENT_CLINIC_OR_DEPARTMENT_OTHER): Payer: Self-pay

## 2024-03-28 DIAGNOSIS — E538 Deficiency of other specified B group vitamins: Secondary | ICD-10-CM | POA: Diagnosis not present

## 2024-03-28 DIAGNOSIS — F1990 Other psychoactive substance use, unspecified, uncomplicated: Secondary | ICD-10-CM | POA: Insufficient documentation

## 2024-03-28 DIAGNOSIS — R202 Paresthesia of skin: Principal | ICD-10-CM

## 2024-03-28 DIAGNOSIS — G320 Subacute combined degeneration of spinal cord in diseases classified elsewhere: Secondary | ICD-10-CM | POA: Diagnosis not present

## 2024-03-28 DIAGNOSIS — F1890 Inhalant use, unspecified, uncomplicated: Secondary | ICD-10-CM

## 2024-03-28 DIAGNOSIS — Z789 Other specified health status: Secondary | ICD-10-CM

## 2024-03-28 DIAGNOSIS — F10920 Alcohol use, unspecified with intoxication, uncomplicated: Secondary | ICD-10-CM | POA: Insufficient documentation

## 2024-03-28 DIAGNOSIS — G992 Myelopathy in diseases classified elsewhere: Secondary | ICD-10-CM | POA: Diagnosis not present

## 2024-03-28 DIAGNOSIS — T410X5A Adverse effect of inhaled anesthetics, initial encounter: Secondary | ICD-10-CM | POA: Diagnosis not present

## 2024-03-28 LAB — CBC WITH DIFFERENTIAL/PLATELET
Abs Immature Granulocytes: 0.02 K/uL (ref 0.00–0.07)
Basophils Absolute: 0 K/uL (ref 0.0–0.1)
Basophils Relative: 0 %
Eosinophils Absolute: 0.1 K/uL (ref 0.0–0.5)
Eosinophils Relative: 1 %
HCT: 43.8 % (ref 39.0–52.0)
Hemoglobin: 15.3 g/dL (ref 13.0–17.0)
Immature Granulocytes: 0 %
Lymphocytes Relative: 32 %
Lymphs Abs: 2.7 K/uL (ref 0.7–4.0)
MCH: 31.4 pg (ref 26.0–34.0)
MCHC: 34.9 g/dL (ref 30.0–36.0)
MCV: 89.9 fL (ref 80.0–100.0)
Monocytes Absolute: 0.5 K/uL (ref 0.1–1.0)
Monocytes Relative: 6 %
Neutro Abs: 5.1 K/uL (ref 1.7–7.7)
Neutrophils Relative %: 61 %
Platelets: 307 K/uL (ref 150–400)
RBC: 4.87 MIL/uL (ref 4.22–5.81)
RDW: 13.2 % (ref 11.5–15.5)
WBC: 8.5 K/uL (ref 4.0–10.5)
nRBC: 0 % (ref 0.0–0.2)

## 2024-03-28 LAB — BASIC METABOLIC PANEL WITH GFR
Anion gap: 16 — ABNORMAL HIGH (ref 5–15)
BUN: 17 mg/dL (ref 6–20)
CO2: 21 mmol/L — ABNORMAL LOW (ref 22–32)
Calcium: 10.4 mg/dL — ABNORMAL HIGH (ref 8.9–10.3)
Chloride: 105 mmol/L (ref 98–111)
Creatinine, Ser: 1.11 mg/dL (ref 0.61–1.24)
GFR, Estimated: >60 mL/min (ref >60)
Glucose, Bld: 109 mg/dL — ABNORMAL HIGH (ref 70–99)
Potassium: 3.7 mmol/L (ref 3.5–5.1)
Sodium: 142 mmol/L (ref 135–145)

## 2024-03-28 LAB — RAPID URINE DRUG SCREEN, HOSP PERFORMED
Amphetamines: NOT DETECTED
Barbiturates: NOT DETECTED
Benzodiazepines: POSITIVE — AB
Cocaine: NOT DETECTED
Opiates: NOT DETECTED
Tetrahydrocannabinol: POSITIVE — AB

## 2024-03-28 LAB — FOLATE: Folate: 18.7 ng/mL (ref >5.9)

## 2024-03-28 LAB — CK: Total CK: 94 U/L (ref 49–397)

## 2024-03-28 LAB — T4, FREE: Free T4: 1.09 ng/dL (ref 0.61–1.12)

## 2024-03-28 LAB — TSH: TSH: 5.88 u[IU]/mL — ABNORMAL HIGH (ref 0.350–4.500)

## 2024-03-28 LAB — VITAMIN B12: Vitamin B-12: <150 pg/mL — ABNORMAL LOW (ref 180–914)

## 2024-03-28 LAB — HIV ANTIBODY (ROUTINE TESTING W REFLEX): HIV Screen 4th Generation wRfx: NONREACTIVE

## 2024-03-28 MED ORDER — THIAMINE MONONITRATE 100 MG PO TABS
100.0000 mg | ORAL_TABLET | Freq: Every day | ORAL | Status: DC
  Administered 2024-03-29: 100 mg via ORAL
  Filled 2024-03-28: qty 1

## 2024-03-28 MED ORDER — ADULT MULTIVITAMIN W/MINERALS CH
1.0000 | ORAL_TABLET | Freq: Every day | ORAL | Status: DC
  Administered 2024-03-29: 1 via ORAL
  Filled 2024-03-28: qty 1

## 2024-03-28 MED ORDER — PROPRANOLOL HCL 10 MG PO TABS
10.0000 mg | ORAL_TABLET | Freq: Two times a day (BID) | ORAL | Status: DC
  Administered 2024-03-28 – 2024-03-29 (×2): 10 mg via ORAL
  Filled 2024-03-28 (×3): qty 1

## 2024-03-28 MED ORDER — ENOXAPARIN SODIUM 40 MG/0.4ML IJ SOSY: 40.0000 mg | PREFILLED_SYRINGE | INTRAMUSCULAR | Status: DC

## 2024-03-28 MED ORDER — GADOBUTROL 1 MMOL/ML IV SOLN
10.0000 mL | Freq: Once | INTRAVENOUS | Status: AC | PRN
  Administered 2024-03-28: 10 mL via INTRAVENOUS

## 2024-03-28 MED ORDER — THIAMINE HCL 100 MG/ML IJ SOLN: 100.0000 mg | Freq: Every day | INTRAMUSCULAR | Status: DC

## 2024-03-28 MED ORDER — CYANOCOBALAMIN 1000 MCG/ML IJ SOLN
5000.0000 ug | Freq: Every day | INTRAMUSCULAR | Status: DC
  Administered 2024-03-28 – 2024-03-29 (×2): 5000 ug via INTRAMUSCULAR
  Filled 2024-03-28 (×2): qty 5

## 2024-03-28 MED ORDER — FOLIC ACID 1 MG PO TABS
1.0000 mg | ORAL_TABLET | Freq: Every day | ORAL | Status: DC
  Administered 2024-03-29: 1 mg via ORAL
  Filled 2024-03-28: qty 1

## 2024-03-28 MED ORDER — DIAZEPAM 5 MG/ML IJ SOLN
2.5000 mg | Freq: Once | INTRAMUSCULAR | Status: AC | PRN
  Administered 2024-03-28: 2.5 mg via INTRAVENOUS
  Filled 2024-03-28: qty 2

## 2024-03-28 NOTE — Assessment & Plan Note (Addendum)
 Polysubstance use in remission for 2 months (other than THC; benzodiazepine on UDS likely due to administration in the ED).  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  Recent history of sole fast food intake has likely exacerbated deficiency and ultimately unmasked clinical findings. - Admit to [facility]MT[facility], attending Dr. Delores - Neurology following, appreciate recommendations - Pending A1c, RPR, HIV, magnesium, phosphorus, homocysteine, methylmalonic acid, CK, vitamin B1, protein electrophoresis, vitamin B6, FSH - Vital signs per floor - Regular diet  - Vitamin B 12 5000 mcg IM daily with oral supplementation thereafter - Folic acid  1 mg daily - Thiamine   Vit B1 100 mg daily - Multivitamin 1 tablet daily - PT/OT to eval treat - AM CBC/BMP  - Fall precautions - TOC consult to touch base and continue to offer resources

## 2024-03-28 NOTE — H&P (Addendum)
 Hospital Admission History and Physical Service Pager: [phone]  Patient name: Roberto Sims Medical record number: [id number] Date of Birth: Sep 10, 1995 Age: 28 y.o. Gender: male  Primary Care Provider: No PCP Consultants: Neurology  Code Status: FULL Code Preferred Emergency Contact:  Contact Information     Name Relation Home Work Mobile   Castella Father [phone]     Chancellor,Freida Mother [phone]        Other Contacts     Name Relation Home Work Mobile   Valley,Stephanie Significant other   [phone]      Chief Complaint: legs numbness and difficulty walking   Differential and Medical Decision Making:  Roberto Sims is a 28 y.o. male presenting with lower extremities tingling and numbness.  Differential for this patient's presentation of this includes  B12 deficiency 2/2 substance use and/or nutritional deficiencies : Most likely patient with history of Nitric Oxide abuse. Tingling and numbness start in extremities and expand toward center of the body. Pending homocysteine level and Methylmalonic acid serum. Vitamin B12 serum <150 Can also consider testing for Pernicious anemia cause B12 deficiency and adding on Intrinsic factor antibody and parietal cell antibody test. Less likely due to malabsorption from normal stools though fast food intake is less nutritious. Guillain-Barr Syndrome (GBS): Less likely, vitamin B12 deficiency noted and more likely, no recent exposure to new medications or infections, MRI shows No acute findings Stroke : Less likely. Slowly onset over the week. No history of stroke. Young age No facial droop or speech difficulty. Peripheral Neuropathy: Patient with  Bilateral numbness, tingling, burning sensation, which affect feet first, however patient has no history of diabetes, alcohol abuse, infections,but could be related to vitamin deficiencies.He denies Decreased sensation to vibration, temperature, and touch.Pending  Vit B1, B6, Folate levels  Radiculopathy such as Lumbar Spinal Stenosis, Herniated Disc. Unlikely as this type of numbness typically localized pain radiating down the leg, often in a dermatomal distribution, with numbness or tingling in the affected leg. In addition his MRI spine shows No evidence of cauda equina syndrome or other acute abnormality.  Multiple Sclerosis (MS): Less likely, patient with no history of autoimmune disorder. Per patient this is the first time he experiencing the symptoms. He denies a relapsing-remitting course or visual disturbances. Vascular Causes  such Peripheral Artery Disease: Less likely. Patient denies pain with walk or at rest, Extremities warm to touch w/ cap refill <2 sec. No Hx of blood clot   Hypothyroidism : Less likely : No Hx of Hyper/Hypothyroidism in family. 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  Recent history of sole fast food intake has likely exacerbated deficiency and ultimately unmasked clinical findings. - Admit to FMTS, attending Dr. Delores - Neurology following, appreciate recommendations - Pending A1c, RPR, HIV, magnesium, phosphorus, homocysteine, methylmalonic acid, CK, vitamin B1, protein electrophoresis, vitamin B6, FSH - Vital signs per floor - Regular diet  - Vitamin B 12 5000 mcg IM daily with oral supplementation thereafter - Folic acid  1 mg daily - Thiamine   Vit B1 100 mg daily - Multivitamin 1 tablet daily - PT/OT to eval treat - AM CBC/BMP  - Fall precautions - TOC consult to touch base and continue to offer  resources Chronic health problem Anxiety: Continue home propranolol   FEN/GI: Regular VTE  Prophylaxis: Lovenox   Disposition: Med-Surg  History of Present Illness:  Roberto Sims is a 28 y.o. male presenting with lower extremities tingling and numbness.   Symptoms started last week.  Symptoms included numbness and tingling in the bottom of his feet that ultimately has moved up his legs and started to make it difficult to walk.  He has felt off balance.  He reports having a history of polysubstance use, though he stopped all use 2 months ago.  Of note, he has used nitrous oxide during that time.  He has also felt that his upper extremities are more difficult to move as well.  He notes a history of anxiety, and he recently started new job, so he feels like he may have had the symptoms before then but not noticed them.  He has a feeling in his chest like a tightness that he attributes to his anxiety.  He lives with his 1 male partner and is only sexually active with her.  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  He denies any recent travel or infection. No changes in bowel habits. No greasy stools.  He initially presented to the ED at [facility] with lower extremity weakness along with tingling and a fall in the shower.  There were concern for GBS and sent to [facility] for further evaluation.  In the ED, patient hemodynamically stable, MRI of his brain and entire neuraxis and is negative for any acute intracranial abnormalities. Tox screen + for Benzo and THC. His B12 serum was significantly low (<150).  He was called for admission for vitamin B12 repletion as well as PT/OT.  Review Of Systems: Per HPI  Pertinent Past Medical History: Overdose Transaminitis Substance induced mood disorder Acute respiratory failure secondary to involuntary drug overdose Anxiety Remainder reviewed in history tab.   Pertinent Social History: Tobacco use:  Yes Alcohol use: Yes , Last drink  Other Substance use: Yes include  Lives with male partner.  Only sexually active with her.  Pertinent Family History: Grandmother with multiple strokes starting at age 29 along with current unknown cancer No autoimmune conditions known  Important Outpatient Medications: propranolol  (INDERAL ) 10 MG tablet Take 10 mg by mouth 2 (two) times daily for anxiety     Objective: BP 120/84 (BP Location: Left Arm)   Pulse 86   Temp 98.2 F (36.8 C)   Resp 18   Ht 5' 10 (1.778 m)   Wt 104.3 kg   SpO2 99%   BMI 33.00 kg/m   Physical Exam Constitutional:      General: He is not in acute distress.    Appearance: Normal appearance. He is not ill-appearing.  HENT:     Head: Normocephalic and atraumatic.     Nose: Nose normal.     Mouth/Throat:     Mouth: Mucous membranes are moist.     Pharynx: Oropharynx is clear.  Eyes:     Extraocular Movements: Extraocular movements intact.     Conjunctiva/sclera: Conjunctivae normal.     Pupils: Pupils are equal, round, and reactive to light.  Cardiovascular:     Rate and Rhythm: Normal rate and regular rhythm.     Pulses: Normal pulses.  Pulmonary:     Effort: Pulmonary effort is normal. No  respiratory distress.     Breath sounds: Normal breath sounds.  Abdominal:     General: Abdomen is flat. Bowel sounds are normal. There is no distension.     Palpations: Abdomen is soft.     Tenderness: There is no abdominal tenderness.  Musculoskeletal:        General: Normal range of motion.     Cervical back: Normal range of motion.  Skin:    General: Skin is warm and dry.     Capillary Refill: Capillary refill takes less than 2 seconds.  Neurological:     Mental Status: He is alert and oriented to person, place, and time.     Cranial Nerves: Cranial nerves 2-12 are intact.     Comments: Strength 4-5 out of 5 and sensation fully intact in bilateral upper extremities. Strength 3 out of 5 and sensation  intermittently intact to light touch along the soles in the bilateral lower extremities.  Psychiatric:        Mood and Affect: Mood normal.        Behavior: Behavior normal.        Thought Content: Thought content normal.        Judgment: Judgment normal.    Labs:  CBC BMET  Recent Labs  Lab 03/28/24 0955  WBC 8.5  HGB 15.3  HCT 43.8  PLT 307   Recent Labs  Lab 03/28/24 0955  NA 142  K 3.7  CL 105  CO2 21*  BUN 17  CREATININE 1.11  GLUCOSE 109*  CALCIUM 10.4*     Drugs of Abuse     Component Value Date/Time   LABOPIA NONE DETECTED 03/28/2024 1515   COCAINSCRNUR NONE DETECTED 03/28/2024 1515   LABBENZ POSITIVE (A) 03/28/2024 1515   AMPHETMU NONE DETECTED 03/28/2024 1515   THCU POSITIVE (A) 03/28/2024 1515   LABBARB NONE DETECTED 03/28/2024 1515    Vitamin B12 less than 150  Imaging Studies Performed: EXAM: MRI BRAIN WITH AND WITHOUT CONTRAST 03/28/2024 01:55:10 PM IMPRESSION: 1. No acute intracranial abnormality. 2. No mass or abnormal enhancement.   EXAM: MRI CERVICAL SPINE WITH AND WITHOUT CONTRAST 03/28/2024 01:55:17 PM IMPRESSION: 1. No acute findings. 2. Leftward disc osteophyte complex at C5-6 with mild left foraminal narrowing. 3. Uncovertebral spurring at C4-5 without significant stenosis.  MRI THORACIC SPINE WITH AND WITHOUT INTRAVENOUS CONTRAST 03/28/2024 01:55:29 PM IMPRESSION: 1. No spinal canal stenosis or neural foraminal narrowing in the thoracic spine. 2. Normal MRI appearance of the spinal cord without and with contrast.  MR Lumbar Spine with and without intravenous contrast. 03/28/2024 01:55:24 PM IMPRESSION: 1. No evidence of cauda equina syndrome or other acute abnormality.  Coralee Arabia, DO 03/28/2024, 6:23 PM PGY-1, [facility]  FPTS Intern pager: [phone], text pages welcome Secure chat group [facility] Teaching Service   I agree with the assessment and plan as documented  above.  Stuart Redo, MD PGY-3, [facility]

## 2024-03-28 NOTE — Hospital Course (Addendum)
 Roberto Sims is a 28 y.o.male with a history of polysubstance use, anxiety, substance-induced mood disorder who was admitted to the [facility] Medicine Teaching Service at [facility] for lower extremity numbness and tingling found to have myelopathy due to vitamin B12 deficiency. 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 MRI brain, and spine were negative for any acute intracranial abnormalities. B12 was found to be significantly low (<150). Neurology was consulted and recommended high dose Vit B12 replacement. Patient will continue Vitamin B12 5000 mcg for total of 5 days (03/28/24 - 04/01/24), followed by 2000 mcg IM for total of 14 days (04/02/24 - 04/15/24), and then continue with Vitamin B12 1000 mcg orally daily thereafter.  Other chronic conditions were medically managed with home medications and formulary alternatives as necessary (Anxiety).  PCP Follow-up Recommendations: Follow up celiac testing and parietal testing. Ensure completion of Vitamin B12 IM injections.

## 2024-03-28 NOTE — ED Provider Notes (Signed)
 [place] [facility] Provider Note   CSN: [id number] Arrival date & time: 03/28/24  0932     History Chief Complaint  Patient presents with   Numbness    HPI: Roberto Sims is a 28 y.o. male with history pertinent for polysubstance use disorder in remission who presents complaining of paresthesias. Patient arrived as a transfer from OSH.  History provided by patient.  No interpreter required during this encounter.  Patient reports that he has a history of polysubstance use disorder in remission for approximately 2 months.  Reports that that he participated in a wide variety of recreational substances including nitrous oxide.  Reports that he has also had poor p.o. intake for approximately a month.  Reports that over the past week he has had progressive paresthesias in his bilateral lower extremities as well as his bilateral hands which have been progressive, and have been making it difficult to ambulate.  Patient initially presented to [facility]  Patient's recorded medical, surgical, social, medication list and allergies were reviewed in the Snapshot window as part of the initial history.   Prior to Admission medications   Medication Sig Start Date End Date Taking? Authorizing Provider  propranolol  (INDERAL ) 10 MG tablet Take 10 mg by mouth 2 (two) times daily.   Yes [provider]  LORazepam  (ATIVAN ) 1 MG tablet Take 1 mg by mouth daily as needed. Patient not taking: Reported on 03/28/2024 02/15/24   [provider]     Allergies: Patient has no known allergies.   Review of Systems   ROS as per HPI  Physical Exam Updated Vital Signs BP 120/84 (BP Location: Left Arm)   Pulse 86   Temp 98.2 F (36.8 C)   Resp 18   Ht 5' 10 (1.778 m)   Wt 104.3 kg   SpO2 99%   BMI 33.00 kg/m  Physical Exam Vitals and nursing note reviewed.  Constitutional:      General: He is not in acute distress.    Appearance: He is  well-developed.  HENT:     Head: Normocephalic and atraumatic.  Eyes:     Conjunctiva/sclera: Conjunctivae normal.  Cardiovascular:     Rate and Rhythm: Normal rate and regular rhythm.     Heart sounds: No murmur heard. Pulmonary:     Effort: Pulmonary effort is normal. No respiratory distress.     Breath sounds: Normal breath sounds.  Abdominal:     Palpations: Abdomen is soft.     Tenderness: There is no abdominal tenderness.  Musculoskeletal:        General: No swelling.     Cervical back: Neck supple.  Skin:    General: Skin is warm and dry.     Capillary Refill: Capillary refill takes less than 2 seconds.  Neurological:     Mental Status: He is alert.     Sensory: Sensory deficit (Paresthesias in bilateral hands and bilateral lower extremities) present.     Motor: Weakness (Globally 4 -/5) present.     Gait: Gait abnormal (Stumbling gait).  Psychiatric:        Mood and Affect: Mood normal.     ED Course/ Medical Decision Making/ A&P    Procedures Procedures   Medications Ordered in ED Medications  cyanocobalamin  (VITAMIN B12) injection 5,000 mcg (has no administration in time range)  propranolol  (INDERAL ) tablet 10 mg (has no administration in time range)  enoxaparin  (LOVENOX ) injection 40 mg (has no administration in time range)  thiamine  (VITAMIN B1) tablet 100 mg (has no administration in time range)    Or  thiamine  (VITAMIN B1) injection 100 mg (has no administration in time range)  folic acid  (FOLVITE ) tablet 1 mg (has no administration in time range)  multivitamin with minerals tablet 1 tablet (has no administration in time range)  diazepam  (VALIUM ) injection 2.5 mg (2.5 mg Intravenous Given 03/28/24 1201)  gadobutrol  (GADAVIST ) 1 MMOL/ML injection 10 mL (10 mLs Intravenous Contrast Given 03/28/24 1318)    Medical Decision Making:   Roberto Sims is a 28 y.o. male who presents for paresthesias as per above.  Physical exam is pertinent for global  weakness, stumbling gait, paresthesias in bilateral hands and bilateral lower extremities  The differential includes but is not limited to demyelinating disorder, GBS, vitamin deficiency, malnutrition.  Independent historian: None  External data reviewed: Notes: Reviewed notes from Dr. Dreama at [facility]  Labs: Ordered, Independent interpretation, and Details: BMP without AKI, emergent electrolyte derangement, emergent LFT abnormality.  CBC without leukocytosis, anemia, thrombocytopenia.  B12 undetectably low  Radiology: Ordered, Independent interpretation, Details: Personally reviewed MRIs of the brain, C, T, L-spine, I do not appreciate focal signal enhancement, mass lesion, or mass effect of surrounding structures, and All images reviewed independently.  Agree with radiology report at this time.   MR Lumbar Spine W Wo Contrast Result Date: 03/28/2024 EXAM: MR Lumbar Spine with and without intravenous contrast. 03/28/2024 01:55:24 PM TECHNIQUE: Multiplanar multisequence MRI of the lumbar spine was performed with and without the administration of intravenous contrast. COMPARISON: None available CLINICAL HISTORY: Low back pain, cauda equina syndrome suspected. Progressive numbness in the low back and extremities FINDINGS: BONES AND ALIGNMENT: Normal vertebral body heights. Normal bone marrow signal. No abnormal enhancement. Normal alignment. SPINAL CORD: Conus medullaris terminates at L1-L2. Normal cord signal and enhancement is present. SOFT TISSUES: No acute abnormality. L1-L2: No disc herniation. No spinal canal stenosis or neural foraminal narrowing. L2-L3: No disc herniation. No spinal canal stenosis or neural foraminal narrowing. L3-L4: No disc herniation. No spinal canal stenosis or neural foraminal narrowing. L4-L5: No disc herniation. No spinal canal stenosis or neural foraminal narrowing. L5-S1: No disc herniation. No spinal canal stenosis or neural foraminal narrowing. IMPRESSION: 1. No  evidence of cauda equina syndrome or other acute abnormality. Electronically signed by: Lonni Necessary MD 03/28/2024 02:40 PM EDT RP Workstation: [id number]   MR THORACIC SPINE W WO CONTRAST Result Date: 03/28/2024 EXAM: MRI THORACIC SPINE WITH AND WITHOUT INTRAVENOUS CONTRAST 03/28/2024 01:55:29 PM TECHNIQUE: Multiplanar multisequence MRI of the thoracic spine was performed with and without the administration of intravenous contrast. COMPARISON: None available. CLINICAL HISTORY: Multiple sclerosis (MS); numbness legs bilaterally, weakness. FINDINGS: BONES AND ALIGNMENT: Normal alignment. Normal vertebral body heights. Bone marrow signal is unremarkable. No abnormal enhancement. SPINAL CORD: Normal spinal cord volume. Normal spinal cord signal. SOFT TISSUES: Unremarkable. DEGENERATIVE CHANGES: No significant disc herniation. No spinal canal stenosis or neural foraminal narrowing. IMPRESSION: 1. No spinal canal stenosis or neural foraminal narrowing in the thoracic spine. 2. Normal MRI appearance of the spinal cord without and with contrast. Electronically signed by: Lonni Necessary MD 03/28/2024 02:35 PM EDT RP Workstation: [id number]   MR Cervical Spine W and Wo Contrast Result Date: 03/28/2024 EXAM: MRI CERVICAL SPINE WITH AND WITHOUT CONTRAST 03/28/2024 01:55:17 PM TECHNIQUE: Multiplanar multisequence MRI of the cervical spine was performed without and with the administration of intravenous contrast. COMPARISON: None available. CLINICAL HISTORY: Multiple sclerosis (MS); evaluate for MS/disc herniation, etiology of numbness and  weakness LE. FINDINGS: BONES AND ALIGNMENT: Straightening of the normal cervical lordosis is present. Normal vertebral body heights. Marrow signal is unremarkable. No abnormal enhancement. SPINAL CORD: Normal spinal cord size. Normal spinal cord signal. SOFT TISSUES: No paraspinal mass. C2-C3: No significant disc herniation. No spinal canal stenosis or neural foraminal  narrowing. C3-C4: No significant disc herniation. No spinal canal stenosis or neural foraminal narrowing. C4-C5: Uncovertebral spurring is present without significant stenosis. C5-C6: A leftward disc osteophyte complex is present. Mild left foraminal narrowing is present. C6-C7: No significant disc herniation. No spinal canal stenosis or neural foraminal narrowing. C7-T1: No significant disc herniation. No spinal canal stenosis or neural foraminal narrowing. IMPRESSION: 1. No acute findings. 2. Leftward disc osteophyte complex at C5-6 with mild left foraminal narrowing. 3. Uncovertebral spurring at C4-5 without significant stenosis. Electronically signed by: Lonni Necessary MD 03/28/2024 02:33 PM EDT RP Workstation: [id number]   MR Brain W and Wo Contrast Result Date: 03/28/2024 EXAM: MRI BRAIN WITH AND WITHOUT CONTRAST 03/28/2024 01:55:10 PM TECHNIQUE: Multiplanar multisequence MRI of the head/brain was performed with and without the administration of intravenous contrast. COMPARISON: CT head without contrast 05/05/2018. CLINICAL HISTORY: Numbness in the mid and low back, bilateral hand and foot numbness with progression recent viral illness. Demyelinating disease. FINDINGS: BRAIN AND VENTRICLES: No acute infarct. No acute intracranial hemorrhage. No mass effect or midline shift. No hydrocephalus. The sella is unremarkable. Normal flow voids. No mass or abnormal enhancement. ORBITS: No acute abnormality. SINUSES: No acute abnormality. BONES AND SOFT TISSUES: Normal bone marrow signal and enhancement. No acute soft tissue abnormality. IMPRESSION: 1. No acute intracranial abnormality. 2. No mass or abnormal enhancement. Electronically signed by: Lonni Necessary MD 03/28/2024 02:26 PM EDT RP Workstation: [id number]    EKG/Medicine tests: Details: Obtained at [facility], repeat not indicated EKG Interpretation: Sinus rhythm No significant change since last tracing Confirmed by Dreama Longs ([id number]) on  03/28/2024 11:40:28 AM                Interventions: Diazepam , B12  See the EMR for full details regarding lab and imaging results.  Patient presents for paresthesias affecting gait, and global weakness.  Initially was seen at OSH, and was transferred to [facility] ED for MRIs and neurology evaluation.  MRI is unrevealing for focal abnormalities, neurology evaluated as did and on both for exams patient has generalized weakness and significant gait difficulty.  Neurology suspects B12 deficiency from nitrous oxide use, labs obtained and patient does have undetectably low B12.  Case rediscussed with neurology, feels that patient would benefit from admission for IM B12 supplementation and PT/OT, medicine consulted for admission.  Discussed patient with Dr. Delores, family medicine, who accepted patient as an admission to their service, no additional acute events while patient was under my care.  Presentation is most consistent with acute complicated illness and Current presentation is complicated by underlying chronic conditions  Discussion of management or test interpretations with external provider(s): Dr. Vanessa, neurology, Dr. Delores, family medicine  Risk Drugs:Prescription drug management Treatment: Decision regarding hospitalization  Disposition: ADMIT: I believe the patient requires admission for further care and management. The patient was admitted to family medicine. Please see inpatient provider note for additional treatment plan details.   MDM generated using voice dictation software and may contain dictation errors.  Please contact me for any clarification or with any questions.  Clinical Impression:  1. Paresthesias   2. B12 deficiency      Admit   Final Clinical Impression(s) /  ED Diagnoses Final diagnoses:  Paresthesias  B12 deficiency    Rx / DC Orders ED Discharge Orders     None        Rogelia Jerilynn RAMAN, MD 03/28/24 1757

## 2024-03-28 NOTE — ED Notes (Signed)
 Patient transported to MRI

## 2024-03-28 NOTE — Consult Note (Addendum)
 NEUROLOGY CONSULT NOTE   Date of service: March 28, 2024 Patient Name: Roberto Sims MRN:  [id number] DOB:  04/08/1996 Chief Complaint: Bilateral lower extremity weakness and difficulty walking Requesting Provider: No att. providers found  History of Present Illness  Roberto Sims is a 28 y.o. male with hx of polysubstance use but quit 2 months ago, history of nitrous oxide abuse who presents with bilateral lower extremity tingling that progressed up his legs and into his arms for the last week.  Patient reports that he has had intermittent tingling in the bottom of his feet for a while.  They will come and go.  He has never been seen by a physician for this.  However, reports that he quit using all substances about 2 months ago.  He started a new job about a month ago.  He reports that he has been eating a lot of fast food for the last month.  He reports that about a week ago, he noted that the tingling started in the bottom of his feet.  He describes it as pins and needle sensation.  It progressed up in his legs over the course of 5 days to get up to his thighs.  He also endorses that about 3 days ago, this progressed to tingling in his fingertips and then up into his forearms.  He has been having difficulty with walking, he is very off balance.  At the insistence of his significant other, he came to the ED for further evaluation workup.  He initially presented to [facility] where there was concern for possible GBS.  He was noted to be hyperreflexic in bilateral lowers, he had tingling in his groin and therefore was transferred to [facility] for neurology evaluation and for MRI of his brain and spinal cord.  Patient had MRI of his brain and entire neuraxis and is negative for any acute intracranial abnormalities.    ROS  Comprehensive ROS performed and pertinent positives documented in HPI   Past History   Past Medical History:  Diagnosis Date   Respiratory failure, acute  (HCC) 05/05/2018    Past Surgical History:  Procedure Laterality Date   boxers fracture Right    great toe fracture Right 2008   Fx. growth plate   KNEE ARTHROSCOPY Left     Family History: History reviewed. No pertinent family history.  Social History  reports that he has quit smoking. He has never used smokeless tobacco. He reports that he does not currently use alcohol. He reports that he does not currently use drugs after having used the following drugs: Cocaine, Marijuana, and Benzodiazepines.  No Known Allergies  Medications   Current Facility-Administered Medications:    cyanocobalamin  (VITAMIN B12) injection 5,000 mcg, 5,000 mcg, Intramuscular, Daily, Remi, Devon, NP  Current Outpatient Medications:    albuterol  (PROVENTIL  HFA;VENTOLIN  HFA) 108 (90 Base) MCG/ACT inhaler, Inhale 1-2 puffs into the lungs every 6 (six) hours as needed for wheezing or shortness of breath. (Patient not taking: Reported on 05/12/2018), Disp: 1 Inhaler, Rfl: 2   benzonatate  (TESSALON ) 100 MG capsule, Take 1 capsule (100 mg total) by mouth 3 (three) times daily as needed for cough. (Patient not taking: Reported on 05/12/2018), Disp: 30 capsule, Rfl: 0   escitalopram  (LEXAPRO ) 10 MG tablet, Take 1 tablet (10 mg total) by mouth daily., Disp: 30 tablet, Rfl: 0   gabapentin  (NEURONTIN ) 100 MG capsule, Take 1 capsule (100 mg total) by mouth 3 (three) times daily., Disp: 90 capsule, Rfl: 0  guaiFENesin -dextromethorphan (ROBITUSSIN DM) 100-10 MG/5ML syrup, Take 5 mLs by mouth every 4 (four) hours as needed for cough (over the counter). (Patient not taking: Reported on 05/12/2018), Disp: 118 mL, Rfl: 0  Vitals   Vitals:   April 07, 2024 0941 04-07-24 1100  BP: (!) 119/94 110/83  Pulse: 79 81  Resp: 20 (!) 29  Temp: 98 F (36.7 C)   TempSrc: Temporal   SpO2: 100% 100%  Weight: 104.3 kg   Height: 5' 10 (1.778 m)     Body mass index is 33 kg/m.   Physical Exam   General: Laying comfortably in  bed; in no acute distress.  HENT: Normal oropharynx and mucosa. Normal external appearance of ears and nose.  Neck: Supple, no pain or tenderness  CV: No JVD. No peripheral edema.  Pulmonary: Symmetric Chest rise. Normal respiratory effort.  Abdomen: Soft to touch, non-tender.  Ext: No cyanosis, edema, or deformity  Skin: No rash. Normal palpation of skin.   Musculoskeletal: Normal digits and nails by inspection. No clubbing.   Neurologic Examination  Mental status/Cognition: Alert, oriented to self, place, month and year, good attention.  Speech/language: Fluent, comprehension intact, object naming intact, repetition intact.  Cranial nerves:   CN II Pupils equal and reactive to light, no VF deficits    CN III,IV,VI EOM intact, no gaze preference or deviation, no nystagmus    CN V normal sensation in V1, V2, and V3 segments bilaterally .   CN VII no asymmetry, no nasolabial fold flattening    CN VIII normal hearing to speech    CN IX & X normal palatal elevation, no uvular deviation    CN XI 5/5 head turn and 5/5 shoulder shrug bilaterally    CN XII midline tongue protrusion    Motor:  Muscle bulk: normal, tone normal. Mvmt Root Nerve  Muscle Right Left Comments  SA C5/6 Ax Deltoid 5 5   EF C5/6 Mc Biceps 5 5   EE C6/7/8 Rad Triceps 5 5   WF C6/7 Med FCR     WE C7/8 PIN ECU     F Ab C8/T1 U ADM/FDI 5. 5   HF L1/2/3 Fem Illopsoas 4+ 4+   KE L2/3/4 Fem Quad     DF L4/5 D Peron Tib Ant 5 5   PF S1/2 Tibial Grc/Sol 5 5    Reflexes:  Right Left Comments  Pectoralis      Biceps (C5/6) 2+ 2+   Brachioradialis (C5/6) 2+ 2+    Triceps (C6/7) 2+ 2+    Patellar (L3/4) 3 3    Achilles (S1) 2 2    Hoffman - -    Plantar absent absent   Jaw jerk    Sensation:  Light touch Decreased in the finger tips and bottom of his feet.   Pin prick Decreaed in finger tips/ hands and bottom of his feet and calfs.   Temperature    Vibration Absent in BL toes, decreased in BL finger tips.   Proprioception    Coordination/Complex Motor:  - Finger to Nose intact BL - Heel to shin with significant ataxia BL - Rapid alternating movement are intact BL - Gait: Stride length short. Arm swing poor. Base width wide. Stomping gait.  Labs/Imaging/Neurodiagnostic studies   CBC:  Recent Labs  Lab Apr 07, 2024 0955  WBC 8.5  NEUTROABS 5.1  HGB 15.3  HCT 43.8  MCV 89.9  PLT 307   Basic Metabolic Panel:  Lab Results  Component Value Date  NA 142 03/28/2024   K 3.7 03/28/2024   CO2 21 (L) 03/28/2024   GLUCOSE 109 (H) 03/28/2024   BUN 17 03/28/2024   CREATININE 1.11 03/28/2024   CALCIUM 10.4 (H) 03/28/2024   GFRNONAA >60 03/28/2024   GFRAA 137 05/12/2018   Lipid Panel: No results found for: LDLCALC HgbA1c: No results found for: HGBA1C Urine Drug Screen:     Component Value Date/Time   LABOPIA NONE DETECTED 05/05/2018 1931   COCAINSCRNUR POSITIVE (A) 05/05/2018 1931   LABBENZ POSITIVE (A) 05/05/2018 1931   AMPHETMU NONE DETECTED 05/05/2018 1931   THCU POSITIVE (A) 05/05/2018 1931   LABBARB NONE DETECTED 05/05/2018 1931    Alcohol Level     Component Value Date/Time   ETH 50 (H) 05/05/2018 0914   INR  Lab Results  Component Value Date   INR 1.28 05/05/2018   APTT  Lab Results  Component Value Date   APTT 36 05/05/2018   AED levels: No results found for: PHENYTOIN, ZONISAMIDE, LAMOTRIGINE, LEVETIRACETA  MRI Brain, C, T, L spine with and without contrast(Personally reviewed): No acute abnormalities noted.  ASSESSMENT   Romari Gasparro is a 28 y.o. male with hx of polysubstance use but quit 2 months ago, history of nitrous oxide abuse who presents with bilateral lower extremity tingling that progressed up his legs and into his arms for the last week.  Neuro exam with sensory ataxia, absent vibratory sensation in BL feet, decreased vibratory sensation in BL finger tips. He is hyperreflexic in BL lower extremities.  He does feel that symptoms  could have been going on for longer than a week.  MRI Brain and C/T/L spine is nonrevealing.  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  RECOMMENDATIONS  - B12 levels pending. Homocysteine and Methylmalonic acid levels ordered - Folate, Thiamine , TSH, B6, SPEP, CK pending. - Urine drug screen ordered and pending. - If above are non revealing, will need to consider LP. - High dose Vit B12 replacement. 5000mcg IM daily x 5 doses, followed by 2000mcg daily IM for 14 days, then switch to PO Vit B12 1000mcg daily.  ______________________________________________________________________   Signed, Kasia Trego, MD [facility]

## 2024-03-28 NOTE — ED Provider Notes (Signed)
 [place] EMERGENCY DEPARTMENT AT [facility] Provider Note   CSN: [id number] Arrival date & time: 03/28/24  0932     Patient presents with: Numbness   Roberto Sims is a 28 y.o. male.   HPI     Both sides have numbness/tingling to both legs to the whole leg and up through abdomen feels like a tingling/soreness to abdomen  Started mildly one week ago in just the feet and now feeling it up to abdomen Hands and forearms feeling that way for 2 days Mostly lower back, having some neck pain as well, moreso tension.  Tingling/numbness lower back Saw chiropractor, massage, blood work  No hx of other medical problems Propranolol  for anxiety, within last few months  1 month ago sick with diarrhea, vomiting and out of work, lasted a few days No recent URI/vaccines  Not allergic, utd vaccines No known fam hx of MS  No headaches Fell in shower because of difficulty walking  Weakness 3 days ago in legs, harder to control feet Denies difficulty talking or walking, visual changes or facial droop.   Cp/dyspnea thinks because feeling nervous  Past Medical History:  Diagnosis Date   Respiratory failure, acute (HCC) 05/05/2018    Prior to Admission medications   Medication Sig Start Date End Date Taking? Authorizing Provider  albuterol  (PROVENTIL  HFA;VENTOLIN  HFA) 108 (90 Base) MCG/ACT inhaler Inhale 1-2 puffs into the lungs every 6 (six) hours as needed for wheezing or shortness of breath. Patient not taking: Reported on 05/12/2018 05/09/18   Rai, Nydia POUR, MD  benzonatate  (TESSALON ) 100 MG capsule Take 1 capsule (100 mg total) by mouth 3 (three) times daily as needed for cough. Patient not taking: Reported on 05/12/2018 05/09/18   Rai, Nydia POUR, MD  escitalopram  (LEXAPRO ) 10 MG tablet Take 1 tablet (10 mg total) by mouth daily. 05/09/18   Rai, Ripudeep K, MD  gabapentin  (NEURONTIN ) 100 MG capsule Take 1 capsule (100 mg total) by mouth 3 (three) times daily.  05/09/18   Rai, Nydia POUR, MD  guaiFENesin -dextromethorphan (ROBITUSSIN DM) 100-10 MG/5ML syrup Take 5 mLs by mouth every 4 (four) hours as needed for cough (over the counter). Patient not taking: Reported on 05/12/2018 05/09/18   Davia Nydia POUR, MD    Allergies: Patient has no known allergies.    Review of Systems  Updated Vital Signs BP 110/83   Pulse 81   Temp 98 F (36.7 C) (Temporal)   Resp (!) 29   Ht 5' 10 (1.778 m)   Wt 104.3 kg   SpO2 100%   BMI 33.00 kg/m   Physical Exam Vitals and nursing note reviewed.  Constitutional:      General: He is not in acute distress.    Appearance: Normal appearance. He is well-developed. He is not ill-appearing or diaphoretic.  HENT:     Head: Normocephalic and atraumatic.  Eyes:     General: No visual field deficit.    Extraocular Movements: Extraocular movements intact.     Conjunctiva/sclera: Conjunctivae normal.     Pupils: Pupils are equal, round, and reactive to light.  Cardiovascular:     Rate and Rhythm: Normal rate and regular rhythm.     Pulses: Normal pulses.     Heart sounds: Normal heart sounds. No murmur heard.    No friction rub. No gallop.  Pulmonary:     Effort: Pulmonary effort is normal. No respiratory distress.     Breath sounds: Normal breath sounds. No wheezing or rales.  Abdominal:     General: There is no distension.     Palpations: Abdomen is soft.     Tenderness: There is no abdominal tenderness. There is no guarding.  Musculoskeletal:        General: No swelling or tenderness.     Cervical back: Normal range of motion.  Skin:    General: Skin is warm and dry.     Findings: No erythema or rash.  Neurological:     General: No focal deficit present.     Mental Status: He is alert and oriented to person, place, and time.     GCS: GCS eye subscore is 4. GCS verbal subscore is 5. GCS motor subscore is 6.     Cranial Nerves: No cranial nerve deficit, dysarthria or facial asymmetry.     Sensory:  Sensory deficit (reports altered sensation to lower extremities  to above umbilicus, altered sensation bilatera hands) present.     Motor: No weakness (LE hip flexion does seem weak for age but techincally 5/5, LLE did appear slightly worse than RLE) or tremor.     Coordination: Coordination normal. Finger-Nose-Finger Test normal.     Gait: Gait normal.     (all labs ordered are listed, but only abnormal results are displayed) Labs Reviewed  BASIC METABOLIC PANEL WITH GFR - Abnormal; Notable for the following components:      Result Value   CO2 21 (*)    Glucose, Bld 109 (*)    Calcium 10.4 (*)    Anion gap 16 (*)    All other components within normal limits  CBC WITH DIFFERENTIAL/PLATELET    EKG: EKG Interpretation Date/Time:  Sunday March 28 2024 10:59:43 EDT Ventricular Rate:  70 PR Interval:  189 QRS Duration:  93 QT Interval:  380 QTC Calculation: 410 R Axis:   78  Text Interpretation: Sinus rhythm No significant change since last tracing Confirmed by Dreama Longs ([id number]) on 03/28/2024 11:40:28 AM  Radiology: No results found.   Procedures   Medications Ordered in the ED  diazepam  (VALIUM ) injection 2.5 mg (has no administration in time range)                                    Medical Decision Making Amount and/or Complexity of Data Reviewed Labs: ordered. Radiology: ordered.  Risk Prescription drug management.   28 year old male with a history of anxiety presents with concern for ascending sensation of numbness and tingling to his bilateral legs and arms over the course of the last week.  Differential diagnosis includes electrolyte abnormalities, MS, Guillain-Barr, transverse myelitis, disc disease, CVA, functional disorder.  He has normal pulses in his bilateral upper and lower extremities, low suspicion for acute arterial thrombus.  Personally evaluated and interpreted labs showing no anemia, no leukocytosis, no clinically significant  electrolyte abnormalities.  He does have a mild anion gap and mildly decreased bicarb, calcium of 10.4 without an albumin.  Will send to [facility] for MRI.  Ordering MRI brain given some concern for asymmetry on exam, and MRI C, T, and L-spine with and without contrast to evaluate for signs of MS, transverse myelitis, mass or disc disease.   Discussed with Dr. Vanessa.  Recommend discussion with Neurology after MRI.  Does have reflexes, however history does raise question for GBS if MRI negative and would appreciate their recommendations.        Final diagnoses:  Numbness  and tingling    ED Discharge Orders     None          Dreama Longs, MD 03/28/24 1144

## 2024-03-28 NOTE — ED Notes (Signed)
 Pt arrived from [facility] at this time. MRI aware pt is here

## 2024-03-28 NOTE — Plan of Care (Signed)
 FMTS Interim Progress Note  S: Patient evaluated at bedside. Nurse at bedside administering B12 injections. Patient reports he feels okay. Denies pain. Discussed with patient that neurology will likely see him in the morning. We will recheck his B12 level. Patient had no further questions or concerns.  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

## 2024-03-28 NOTE — Assessment & Plan Note (Addendum)
 Anxiety: Continue home propranolol 

## 2024-03-28 NOTE — ED Triage Notes (Addendum)
 Numbness in mid to lower back, bilateral hand and bilateral feet. Progressive worsening over the last week. Was lifting weights last week and felt like he tweaked his back. Has taken tylenol  and ibuprofen  with moderate relief. Denies loss of bowel or bladder control. Reports mild numbness to groin area.

## 2024-03-28 NOTE — ED Notes (Signed)
 Patient going POV to [facility] for MRI.  Dr. MARLA Bough is accepting

## 2024-03-28 NOTE — Plan of Care (Signed)

## 2024-03-29 ENCOUNTER — Other Ambulatory Visit (HOSPITAL_COMMUNITY): Payer: Self-pay

## 2024-03-29 ENCOUNTER — Encounter: Payer: Self-pay | Admitting: Neurology

## 2024-03-29 DIAGNOSIS — G320 Subacute combined degeneration of spinal cord in diseases classified elsewhere: Secondary | ICD-10-CM | POA: Diagnosis not present

## 2024-03-29 DIAGNOSIS — E538 Deficiency of other specified B group vitamins: Secondary | ICD-10-CM | POA: Diagnosis not present

## 2024-03-29 LAB — CBC
HCT: 40.8 % (ref 39.0–52.0)
Hemoglobin: 13.8 g/dL (ref 13.0–17.0)
MCH: 31.2 pg (ref 26.0–34.0)
MCHC: 33.8 g/dL (ref 30.0–36.0)
MCV: 92.1 fL (ref 80.0–100.0)
Platelets: 284 K/uL (ref 150–400)
RBC: 4.43 MIL/uL (ref 4.22–5.81)
RDW: 13.2 % (ref 11.5–15.5)
WBC: 8.5 K/uL (ref 4.0–10.5)
nRBC: 0 % (ref 0.0–0.2)

## 2024-03-29 LAB — COMPREHENSIVE METABOLIC PANEL WITH GFR
ALT: 40 U/L (ref 0–44)
AST: 20 U/L (ref 15–41)
Albumin: 3.7 g/dL (ref 3.5–5.0)
Alkaline Phosphatase: 66 U/L (ref 38–126)
Anion gap: 11 (ref 5–15)
BUN: 19 mg/dL (ref 6–20)
CO2: 24 mmol/L (ref 22–32)
Calcium: 9.1 mg/dL (ref 8.9–10.3)
Chloride: 106 mmol/L (ref 98–111)
Creatinine, Ser: 1.19 mg/dL (ref 0.61–1.24)
GFR, Estimated: >60 mL/min (ref >60)
Glucose, Bld: 106 mg/dL — ABNORMAL HIGH (ref 70–99)
Potassium: 3.7 mmol/L (ref 3.5–5.1)
Sodium: 141 mmol/L (ref 135–145)
Total Bilirubin: 0.9 mg/dL (ref 0.0–1.2)
Total Protein: 6.6 g/dL (ref 6.5–8.1)

## 2024-03-29 LAB — RPR: RPR Ser Ql: NONREACTIVE

## 2024-03-29 LAB — HOMOCYSTEINE: Homocysteine: 82.8 umol/L — ABNORMAL HIGH (ref 0.0–14.5)

## 2024-03-29 LAB — HEMOGLOBIN A1C
Hgb A1c MFr Bld: 5 % (ref 4.8–5.6)
Mean Plasma Glucose: 96.8 mg/dL

## 2024-03-29 LAB — PHOSPHORUS: Phosphorus: 6 mg/dL — ABNORMAL HIGH (ref 2.5–4.6)

## 2024-03-29 LAB — MAGNESIUM: Magnesium: 2.2 mg/dL (ref 1.7–2.4)

## 2024-03-29 MED ORDER — FOLIC ACID 1 MG PO TABS: 1.0000 mg | ORAL_TABLET | Freq: Every day | ORAL | Status: AC

## 2024-03-29 MED ORDER — ADULT MULTIVITAMIN W/MINERALS CH: 1.0000 | ORAL_TABLET | Freq: Every day | ORAL | Status: AC

## 2024-03-29 MED ORDER — CYANOCOBALAMIN 1000 MCG/ML IJ SOLN: INTRAMUSCULAR | 0 refills | Status: AC

## 2024-03-29 MED ORDER — CYANOCOBALAMIN 1000 MCG/ML IJ SOLN
INTRAMUSCULAR | 0 refills | Status: DC
  Filled 2024-03-29: qty 14, 2d supply, fill #0

## 2024-03-29 MED ORDER — BD LUER-LOK SYRINGE 25G X 1" 3 ML MISC
1.0000 | Freq: Every day | 0 refills | Status: AC
  Filled 2024-03-29: qty 14, 14d supply, fill #0

## 2024-03-29 MED ORDER — SYRINGE (DISPOSABLE) 5 ML MISC
1.0000 | Freq: Every day | 0 refills | Status: AC
  Filled 2024-03-29: qty 50, fill #0

## 2024-03-29 MED ORDER — VITAMIN B-12 1000 MCG PO TABS
1000.0000 ug | ORAL_TABLET | Freq: Every day | ORAL | 0 refills | Status: AC
  Filled 2024-03-29: qty 90, 90d supply, fill #0

## 2024-03-29 MED ORDER — VITAMIN B-1 100 MG PO TABS: 100.0000 mg | ORAL_TABLET | Freq: Every day | ORAL | Status: AC

## 2024-03-29 NOTE — Progress Notes (Signed)
     Daily Progress Note Intern Pager: [phone]  Patient name: Roberto Sims Medical record number: [id number] Date of birth: 1995/11/13 Age: 28 y.o. Gender: male  Primary Care Provider: Patient, No Pcp Per Consultants: Neurology Code Status: Full Code  Pt Overview and Major Events to Date:  03/28/24 : Admitted for Lower extremities weakness, numbness and difficulty walking   Assessment Roberto Sims is a 28 y.o. 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 States lower extremities numbness and tingling slightly improved. Denies pain.He was able to get out of bed to the bathroom OVN but remain weak.   Objective: Temp:  [97.5 F (36.4 C)-98.2 F (36.8 C)] 98.1 F (36.7 C) (09/15 0423) Pulse Rate:  [59-86] 59 (09/15 0423) Resp:  [16-29] 18 (09/15 0423) BP: (99-127)/(68-94) 99/69 (09/15 0423) SpO2:  [98 %-100 %] 99 % (09/15 0423) Weight:  [104.3  kg] 104.3 kg (09/14 0941) Physical Exam: General: Normal appearance. He is not ill-appearing  Cardiovascular: Normal rate and regular rhythm. Normal pulses.  Respiratory:  No respiratory distress Normal breath sounds  Abdomen: Abdomen is flat, soft. No distension Neuro: Ox4 Strength 4-5 out of 5 and sensation fully intact in bilateral upper extremities.Strength 3 out of 5 and sensation intermittently intact to light touch along the soles in the bilateral lower extremities.   Laboratory: Most recent CBC Lab Results  Component Value Date   WBC 8.5 03/29/2024   HGB 13.8 03/29/2024   HCT 40.8 03/29/2024   MCV 92.1 03/29/2024   PLT 284 03/29/2024   Most recent BMP    Latest Ref Rng & Units 03/29/2024    3:28 AM  BMP  Glucose 70 - 99 mg/dL 893   BUN 6 - 20 mg/dL 19   Creatinine 9.38 - 1.24 mg/dL 8.80   Sodium 864 - 854 mmol/L 141   Potassium 3.5 - 5.1 mmol/L 3.7   Chloride 98 - 111 mmol/L 106   CO2 22 - 32 mmol/L 24   Calcium 8.9 - 10.3 mg/dL 9.1     Other pertinent labs  Vit B12 <150    Imaging/Diagnostic Tests: No new imaging since admission  Suzen Houston NOVAK, DO 03/29/2024, 8:35 AM  PGY-1, [facility] Health Family Medicine FPTS Intern pager: [phone], text pages welcome Secure chat group [facility] Teaching Service

## 2024-03-29 NOTE — TOC Progression Note (Signed)
 Transition of Care [facility]) - Progression Note    Patient Details  Name: Roberto Sims MRN: [id number] Date of Birth: 02/15/1996  Transition of [facility]) CM/SW Contact  Sherline Clack, [place] Phone Number: 03/29/2024, 10:58 AM  Clinical Narrative:     CSW spoke with patient at bedside about substance use. Patient shares he goes to meetings and has a good support system. He does not report recent substance use. Patient did not request resources.  CSW will sign off for now as social work intervention is no longer needed. Please consult us  again if new needs arise.   Social Drivers of Health (SDOH) Interventions SDOH Screenings   Food Insecurity: No Food Insecurity (03/28/2024)  Housing: Unknown (03/28/2024)  Transportation Needs: No Transportation Needs (03/28/2024)  Utilities: Not At Risk (03/28/2024)  Depression (PHQ2-9): Medium Risk (05/08/2019)  Tobacco Use: Medium Risk (03/28/2024)

## 2024-03-29 NOTE — Care Management Obs Status (Addendum)
 MEDICARE OBSERVATION STATUS NOTIFICATION   Patient Details  Name: Roberto Sims MRN: [id number] Date of Birth: 11/16/1995   Medicare Observation Status Notification Given:  Yes Verbally reviewed observation notice with Roberto Sims telephonically at [phone].  Copy will be delivered to the patient room    Yi Haugan 03/29/2024, 2:34 PM

## 2024-03-29 NOTE — Care Management Obs Status (Signed)
 MEDICARE OBSERVATION STATUS NOTIFICATION   Patient Details  Name: Roberto Sims MRN: [id number] Date of Birth: 17-Apr-1996   Medicare Observation Status Notification Given:  Yes Verbally reviewed observation notice with [name] telephonically at [phone].  Copy will be delivered to the patient room   [name] 03/29/2024, 12:24 PM

## 2024-03-29 NOTE — Discharge Summary (Addendum)
 Family Medicine Teaching [facility] Discharge Summary  Patient name: Roberto Sims Medical record number: [id number] Date of birth: 1995-12-01 Age: 28 y.o. 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 MRI brain, and spine were negative for any acute intracranial abnormalities. B12 was found to be significantly low (<150). Neurology was consulted and recommended high dose Vit B12 replacement. Patient will continue Vitamin B12 5000 mcg for total of 5 days (03/28/24 - 04/01/24), followed by 2000 mcg IM for total of 14 days (04/02/24 - 04/15/24), and then continue with Vitamin B12 1000 mcg orally daily thereafter. Patient was instructed on how to administer Vitamin B12 injections prior to discharge and exhibited understanding. Autoimmune labs were performed to consider other reasons for Vitamin B12  deficiency and will require follow up at hospital follow up appointment.  Other chronic conditions were medically managed with home medications and formulary alternatives as necessary (Anxiety).  PCP Follow-up Recommendations: Follow up celiac testing and parietal testing. Ensure completion of Vitamin B12 IM injections. Please ensure follow up with outpatient neurology.    Results/Tests Pending at Time of Discharge:  Unresulted Labs (From admission, onward)     Start     Ordered   03/29/24 1033  IgA  Once,   R        03/29/24 1032   03/29/24 1033  Glia (IgA/G) + tTG IgA  Once,   R        03/29/24 1032   03/29/24 1033  Anti-parietal antibody  Once,   R        03/29/24 1032   03/29/24 1033  Intrinsic Factor Antibodies  Once,   R        03/29/24 1032   03/28/24 1921  T3, free  Add-on,   AD        03/28/24 1920   03/28/24 1631  Homocysteine  Once,   URGENT        03/28/24 1630   03/28/24 1631  Methylmalonic acid, serum  Once,   URGENT        03/28/24 1630   03/28/24 1515  Vitamin B6  Once,   URGENT        03/28/24 1514   03/28/24 1515  Protein electrophoresis, serum  Once,   URGENT        03/28/24 1514   03/28/24 1515  Vitamin B1  Once,   URGENT        03/28/24 1514           Disposition: Home  Discharge Condition: Stable  Discharge Exam:  Vitals:   03/29/24 0835 03/29/24 1100  BP: 109/75 113/76  Pulse: 70 73  Resp: 16 16  Temp: 97.9 F (36.6 C) 98 F (36.7 C)  SpO2: 100% 99%   General: Normal appearance. He is not ill-appearing  Cardiovascular: Normal rate and regular rhythm. Normal pulses.  Respiratory:  No respiratory distress Normal breath sounds  Abdomen: Abdomen is flat, soft. No distension Neuro: Ox4 Strength 4-5 out of 5 and sensation fully intact in bilateral upper extremities.Strength 3 out of 5 and sensation intermittently intact to light touch along the soles in the bilateral lower extremities.   Per Dr. Coralee  Significant Procedures:  None  Significant Labs and Imaging:  Recent Labs  Lab 03/28/24 0955 03/29/24 0328  WBC 8.5 8.5  HGB 15.3 13.8  HCT 43.8 40.8  PLT 307 284   Recent Labs  Lab 03/28/24 0955 03/29/24 0328  NA 142 141  K 3.7 3.7  CL 105 106  CO2 21* 24  GLUCOSE 109* 106*  BUN 17 19  CREATININE 1.11 1.19  CALCIUM 10.4* 9.1  MG  --  2.2  PHOS  --  6.0*  ALKPHOS  --  66  AST  --  20  ALT  --  40  ALBUMIN  --  3.7   MR Brain w/ w/o contrast 1. No acute intracranial abnormality. 2. No mass or abnormal enhancement.  MR Cervical Spine w/ w/o contrast 1. No acute findings. 2. Leftward disc osteophyte complex at C5-6 with mild left foraminal narrowing. 3. Uncovertebral spurring at C4-5 without significant stenosis.  MR Lumbar Spine w/ w/o contrast 1. No evidence of cauda equina syndrome or other acute abnormality.  MR Thoracic Spine w/ w/o contrast 1. No spinal canal stenosis or neural foraminal narrowing in the thoracic spine. 2. Normal MRI appearance of the spinal cord without and with contrast.  Discharge Medications:  Allergies as of 03/29/2024   No Known Allergies      Medication List     PAUSE taking these medications    LORazepam  1 MG tablet Wait to take this until your doctor or other care provider tells you to start again. Commonly known as: ATIVAN  Take 1 mg by mouth daily as needed.       TAKE these medications    B-D 3CC LUER-LOK SYR 25GX1 25G X 1 3 ML Misc Generic drug: SYRINGE-NEEDLE (DISP) 3 ML Use to inject vitamin B12 into the muscle daily as instructed.   cyanocobalamin  1000 MCG tablet Commonly known as: VITAMIN B12 Take 1 tablet (1,000 mcg total) by mouth daily.   cyanocobalamin  1000 MCG/ML injection Commonly known as: VITAMIN B12 Inject 5 mLs (5,000 mcg total) into the muscle daily for 1 day, THEN 2 mLs (2,000 mcg total) daily for 14 days. Start taking on: March 30, 2024   folic acid  1 MG tablet Commonly known as: FOLVITE  Take 1 tablet (1  mg total) by mouth daily. Start taking on: March 30, 2024   multivitamin with minerals Tabs tablet Take 1 tablet by mouth daily. Start taking on: March 30, 2024   propranolol  10 MG tablet Commonly known as: INDERAL  Take 10 mg by mouth 2 (two) times daily.   Syringe (Disposable) 5 ML Misc 1 each by Does not apply route daily. Use to inject vitamin B12 into the muscle daily as instructed.   thiamine  100 MG tablet Commonly known as: Vitamin B-1 Take 1 tablet (100 mg total) by mouth daily.  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  Patient was counseled important signs and symptoms that should prompt return to medical care, changes in medications, dietary instructions, activity restrictions, and follow up appointments.   Follow-Up Appointments:  Follow-up Information     [facility] Follow up.   Specialty: Rehabilitation Why: Please call and follow up regarding needed Outpatient therapy. Contact information: [address] [place]  [address] [phone]        Care, [facility] Follow up.   Why: [employer] will deliver a rolator  to the bedside. Contact information: [address] [place] [address] [phone]         [facility]. Schedule an  appointment as soon as possible for a visit.   Why: Please call and follow up regarding your vitamin B12 deficiency, will need to specify Dr. Tobie or Dr. Venetia Potters Contact information: [address] [place], [address] [place]  [address] [phone]                Coralee Arabia, DO PGY-1 Janna Ferrier, DO 03/29/2024, 1:30 PM PGY-2, [facility]

## 2024-03-29 NOTE — Discharge Instructions (Addendum)
 Dear Roberto Sims,  Thank you for letting us  participate in your care. 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 You were treated with Vitamin B12 intramuscular injections. This will be continued outpatient.   Vitamin B12 5000 mcg daily intramuscular injection for 5 days (9/14 - 9/18) Vitamin B12 2000 mcg daily intramuscular injection for 14 days (9/19 -  10/02) Vitamin B12 1000 mcg oral tablets daily thereafter  POST-HOSPITAL & CARE INSTRUCTIONS Go to your follow up appointments (listed below)   DOCTOR'S APPOINTMENT   No future appointments.   Follow-up Information     [facility] Follow up.   Specialty: Rehabilitation Why: Please call and follow up regarding needed Outpatient therapy. Contact information: [address] [place]  [phone]        Care, [facility] Follow up.   Why: [employer] will deliver a rolator  to the bedside. Contact information: [address] [place] [address] [phone]         [facility]. Schedule an appointment as soon as possible for a visit.   Why: Please call and follow up regarding your vitamin B12 deficiency, will need to specify Dr. Tobie or Dr. Venetia Potters Contact information: [address] [place], [address] [place]  [address] [phone]                Take care and be well!  Family Medicine Teaching Service Inpatient Team [place]  [facility]  [address] [place], [place] [address] [phone]

## 2024-03-29 NOTE — Evaluation (Signed)
 Physical Therapy Evaluation Patient Details Name: Roberto Sims MRN: [id number] DOB: 1996-01-23 Today's Date: 03/29/2024  History of Present Illness  28 y.o. male presenting 9/14 with lower extremities tingling and numbness, which caused fall in shower. 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  MRI of his brain and entire neuraxis and is negative for any acute intracranial abnormalities EFY:dladujwrz induced mood disorder, acute respiratory failure do to involuntary drug overdose, anxiety an transaminitis.  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 Pt is currently limited in safe mobility by decreased sensation and strength that worsens with fatigue. Pt is independent with bed mobility, and contact guard for transfers and ambulation with RW and Rollator. Pt with decreasing L foot dorsiflexion and hip abduction causing 2x LoB that benefited from light min A for steadying. PT recommending OP Neuro PT to work on strength and balance, in addition to gait training. PT will continue to follow acutely.         If plan is discharge home, recommend the following: A little help with walking and/or transfers;A little help with bathing/dressing/bathroom;Assistance with cooking/housework;Help with stairs or ramp for entrance;Assist for transportation   Can travel by private vehicle    Yes    Equipment Recommendations Rollator (4 wheels)  Recommendations for Other Services       Functional Status Assessment Patient has had a recent decline in their functional status and demonstrates the ability to make significant improvements in function in a reasonable and predictable amount of time.     Precautions / Restrictions Precautions Precautions: Fall Recall of Precautions/Restrictions: Intact Restrictions Weight Bearing Restrictions Per Provider Order: No      Mobility   Bed Mobility Overal bed mobility: Independent                  Transfers Overall transfer level: Needs assistance Equipment used: Rolling walker (2 wheels) Transfers: Sit to/from Stand, Bed to chair/wheelchair/BSC Sit to Stand: Contact guard assist           General transfer comment: contact guard sit to stand from bed to RW and later to Rollator    Ambulation/Gait Ambulation/Gait assistance: Contact guard assist, Min assist Gait Distance (Feet): 100 Feet (x2) Assistive device: Rolling walker (2 wheels), Rollator (4 wheels) Gait Pattern/deviations: Decreased dorsiflexion - left, Decreased stance time - left, Step-through pattern, Narrow base of support, Knees buckling, Decreased weight shift to right Gait velocity: variable, velocity increases with fatigue, recommended seated rest break as instead of rushing Gait velocity interpretation: 1.31 - 2.62 ft/sec, indicative of limited community ambulator   General Gait Details: pt overall contact guard 2x LoB requiring light minA for steadying, pt with increasing fatigue and weakness with distance, resulting in decreased L foot dorsiflexion and increase L foot slap, and narrowing BoS, constant cuing for widening BoS, MD recommended Rollator given pt has to stand for work at least that would provide pt with place to sit. Pt with similar fatiguing but appreciates place to sit.  Stairs Stairs: Yes Stairs assistance: Min assist Stair Management: One rail Right Number of Stairs: 1 General stair comments: pt report he is able to grab door jam to clime the 2 steps into his home         Balance Overall balance assessment: Independent Sitting-balance support: Feet supported Sitting balance-Leahy Scale: Good     Standing balance support: No upper extremity supported Standing balance-Leahy  Scale: Fair                               Pertinent Vitals/Pain Pain Assessment Pain Assessment: No/denies pain    Home  Living Family/patient expects to be discharged to:: Private residence Living Arrangements: Spouse/significant other Available Help at Discharge: Family;Available PRN/intermittently Type of Home: House Home Access: Stairs to enter Entrance Stairs-Rails: None Entrance Stairs-Number of Steps: 2   Home Layout: One level Home Equipment: None      Prior Function Prior Level of Function : Independent/Modified Independent;Working/employed;Driving               ADLs Comments: Ind     Extremity/Trunk Assessment   Upper Extremity Assessment Upper Extremity Assessment: Defer to OT evaluation RUE Sensation: decreased light touch LUE Sensation: decreased light touch    Lower Extremity Assessment Lower Extremity Assessment: RLE deficits/detail;LLE deficits/detail RLE Deficits / Details: ROM WFL, hip flex 4/5, knee flex/ext 4/5, ankle dorsi/plantar flex 4/5 RLE Sensation: decreased light touch (decreased sensation greatest distally and extend up to mid thigh) RLE Coordination: decreased fine motor LLE Deficits / Details: ROM Full, hip strength 3+/5, knee flex/ext 4/5, ankle dorsi/plantar 4/5 LLE Sensation: decreased light touch (greater peripherally but extend high up his thigh) LLE Coordination: decreased fine motor;decreased gross motor    Cervical / Trunk Assessment Cervical / Trunk Assessment: Normal  Communication   Communication Communication: No apparent difficulties    Cognition Arousal: Alert Behavior During Therapy: WFL for tasks assessed/performed                             Following commands: Intact       Cueing Cueing Techniques: Verbal cues     General Comments General comments (skin integrity, edema, etc.): 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, educated that his strength in addition to the sensation decrease with fatigue and he needs to rest as much as he can        Assessment/Plan    PT Assessment Patient needs  continued PT services  PT Problem List Decreased strength;Decreased activity tolerance;Decreased balance;Decreased mobility;Impaired sensation;Pain;Decreased safety awareness       PT Treatment Interventions DME instruction;Gait training;Stair training;Functional mobility training;Therapeutic activities;Therapeutic exercise;Balance training;Cognitive remediation;Neuromuscular re-education;Patient/family education    PT Goals (Current goals can be found in the Care Plan section)  Acute Rehab PT Goals PT Goal Formulation: With patient Time For Goal Achievement: 04/12/24 Potential to Achieve Goals: Fair    Frequency Min 2X/week        AM-PAC PT 6 Clicks Mobility  Outcome Measure Help needed turning from your back to your side while in a flat bed without using bedrails?: None Help needed moving from lying on your back to sitting on the side of a flat bed without using bedrails?: None Help needed moving to and from a bed to a chair (including a wheelchair)?: None Help needed standing up from a chair using your arms (e.g., wheelchair or bedside chair)?: None Help needed to walk in hospital room?: A Little Help needed climbing 3-5 steps with a railing? : A Little 6 Click Score: 22    End of Session Equipment Utilized During Treatment: Gait belt Activity Tolerance: Patient tolerated treatment well Patient left: in bed;with call bell/phone within reach Nurse Communication: Mobility status PT Visit Diagnosis: Difficulty in walking, not elsewhere classified (R26.2);Muscle weakness (generalized) (  M62.81);Other abnormalities of gait and mobility (R26.89);History of falling (Z91.81)    Time: 9085-9071 PT Time Calculation (min) (ACUTE ONLY): 14 min   Charges:   PT Evaluation $PT Eval Low Complexity: 1 Low PT Treatments $Gait Training: 8-22 mins PT General Charges $$ ACUTE PT VISIT: 1 Visit         Jasson Siegmann B. Fleeta Lapidus PT, DPT Acute Rehabilitation Services Please use secure  chat or  Call Office [phone]   Almarie KATHEE Fleeta Fleet 03/29/2024, 11:00 AM

## 2024-03-29 NOTE — Assessment & Plan Note (Signed)
 Anxiety: Continue home propranolol 

## 2024-03-29 NOTE — Progress Notes (Signed)
 Transition of Care [facility]) - Inpatient Brief Assessment   Patient Details  Name: Printice Hellmer MRN: [id number] Date of Birth: 03/01/1996  Transition of Care [facility]) CM/SW Contact:    CM met with the patient at the bedside to discuss IP Care management needs.  Patient plans to return home when stable for discharge.  Patient is normally independent and plans to return home with partner when stable.  Patient needs Rolator for home.  DME order placed and [employer] called to provide Rolator at the bedside today.  Referral placed for OPPT [address] location.  No other IP Care management needs and patient will discharge home when stable.   Transition of Care Asessment: Insurance and Status: (P) Insurance coverage has been reviewed Patient has primary care physician: (P) Yes Home environment has been reviewed: (P) from home with partner Prior level of function:: (P) self Prior/Current Home Services: (P) No current home services Social Drivers of Health Review: (P) SDOH reviewed interventions complete Readmission risk has been reviewed: (P) Yes Transition of care needs: (P) transition of care needs identified, TOC will continue to follow

## 2024-03-29 NOTE — Assessment & Plan Note (Signed)
 Polysubstance use in remission for 2 months. 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

## 2024-03-29 NOTE — Evaluation (Signed)
 Occupational Therapy Evaluation Patient Details Name: Roberto Sims MRN: [id number] DOB: 01/27/1996 Today's Date: 03/29/2024   History of Present Illness   28 y.o. male presenting 9/14 with lower extremities tingling and numbness, which caused fall in shower. 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  MRI of his brain and entire neuraxis and is negative for any acute intracranial abnormalities EFY:dladujwrz induced mood disorder, acute respiratory failure do to involuntary drug overdose, anxiety an transaminitis.     Clinical Impressions Patient admitted for the diagnosis above.  PTA he lives at home with his spouse and needed no assist with any aspect of ADL,iADL and mobility.  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  Currently he presents with numbness to bilateral upper and lower extremities, with accompanying unsteadiness.  Overall he is needing up to CGA for in room mobility/toileting and lower body ADL from a sit to stand level.  OT can continue efforts in the acute setting to address deficits, and no post acute OT is anticipated.        If plan is discharge home, recommend the following:   Assist for transportation     Functional Status Assessment   Patient has had a recent decline in their functional status and demonstrates the ability to make significant improvements in function in a reasonable and predictable amount of time.     Equipment Recommendations   Tub/shower seat     Recommendations for Other Services         Precautions/Restrictions   Precautions Precautions: Fall Recall of Precautions/Restrictions: Intact Restrictions Weight Bearing Restrictions Per Provider Order: No     Mobility Bed Mobility Overal bed mobility: Independent                  Transfers Overall transfer level: Needs assistance Equipment used: None Transfers: Sit to/from  Stand, Bed to chair/wheelchair/BSC Sit to Stand: Contact guard assist     Step pivot transfers: Contact guard assist            Balance Overall balance assessment: Needs assistance Sitting-balance support: Feet supported Sitting balance-Leahy Scale: Good     Standing balance support: No upper extremity supported Standing balance-Leahy Scale: Fair                             ADL either performed or assessed with clinical judgement   ADL       Grooming: Wash/dry hands;Supervision/safety;Standing               Lower Body Dressing: Contact guard assist;Sit to/from stand   Toilet Transfer: Production manager                   Vision Baseline Vision/History: 0 No visual deficits Patient Visual Report: No change from baseline       Perception Perception: Within Functional Limits       Praxis Praxis: WFL       Pertinent Vitals/Pain Pain Assessment Pain Assessment: No/denies pain     Extremity/Trunk Assessment Upper Extremity Assessment Upper Extremity Assessment: Right hand dominant;RUE deficits/detail;LUE deficits/detail RUE Sensation: decreased light touch LUE Sensation: decreased light touch   Lower Extremity Assessment Lower Extremity Assessment: Defer to PT evaluation   Cervical / Trunk Assessment Cervical / Trunk Assessment: Normal   Communication Communication Communication: No apparent difficulties   Cognition Arousal: Alert Behavior During Therapy:  WFL for tasks assessed/performed Cognition: No apparent impairments                               Following commands: Intact       Cueing  General Comments   Cueing Techniques: Verbal cues      Exercises     Shoulder Instructions      Home Living Family/patient expects to be discharged to:: Private residence Living Arrangements: Spouse/significant other Available Help at Discharge: Family;Available  PRN/intermittently Type of Home: House Home Access: Stairs to enter [employer] of Steps: 2 Entrance Stairs-Rails: None Home Layout: One level     Bathroom Shower/Tub: Chief Strategy Officer: Standard     Home Equipment: None          Prior Functioning/Environment Prior Level of Function : Independent/Modified Independent;Working/employed;Driving               ADLs Comments: Ind    OT Problem List: Impaired balance (sitting and/or standing)   OT Treatment/Interventions: Self-care/ADL training;Therapeutic activities;Balance training      OT Goals(Current goals can be found in the care plan section)   Acute Rehab OT Goals Patient Stated Goal: Return home OT Goal Formulation: With patient Time For Goal Achievement: 04/12/24 Potential to Achieve Goals: Good ADL Goals Pt Will Perform Grooming: Independently;standing Pt Will Perform Lower Body Dressing: Independently;sit to/from stand Pt Will Transfer to Toilet: Independently;ambulating;regular height toilet   OT Frequency:  Min 2X/week    Co-evaluation              AM-PAC OT 6 Clicks Daily Activity     Outcome Measure Help from another person eating meals?: None Help from another person taking care of personal grooming?: A Little Help from another person toileting, which includes using toliet, bedpan, or urinal?: A Little Help from another person bathing (including washing, rinsing, drying)?: A Little Help from another person to put on and taking off regular upper body clothing?: None Help from another person to put on and taking off regular lower body clothing?: A Little 6 Click Score: 20   End of Session Equipment Utilized During Treatment: Gait belt Nurse Communication: Mobility status  Activity Tolerance: Patient tolerated treatment well Patient left: in chair  OT Visit Diagnosis: Unsteadiness on feet (R26.81)                Time: 9147-9090 OT Time Calculation (min):  17 min Charges:  OT General Charges $OT Visit: 1 Visit OT Evaluation $OT Eval Moderate Complexity: 1 Mod  03/29/2024  RP, OTR/L  Acute Rehabilitation Services  Office:  [phone]   Charlie JONETTA Halsted 03/29/2024, 9:15 AM

## 2024-03-30 LAB — IGA: IgA: 236 mg/dL (ref 90–386)

## 2024-03-30 LAB — ANTI-PARIETAL ANTIBODY: Parietal Cell Antibody-IgG: 1 U (ref 0.0–20.0)

## 2024-03-30 LAB — PROTEIN ELECTROPHORESIS, SERUM
A/G Ratio: 1.1 (ref 0.7–1.7)
Albumin ELP: 3.9 g/dL (ref 2.9–4.4)
Alpha-1-Globulin: 0.2 g/dL (ref 0.0–0.4)
Alpha-2-Globulin: 1 g/dL (ref 0.4–1.0)
Beta Globulin: 1.2 g/dL (ref 0.7–1.3)
Gamma Globulin: 1 g/dL (ref 0.4–1.8)
Globulin, Total: 3.4 g/dL (ref 2.2–3.9)
Total Protein ELP: 7.3 g/dL (ref 6.0–8.5)

## 2024-03-30 LAB — T3, FREE: T3, Free: 3.7 pg/mL (ref 2.0–4.4)

## 2024-03-30 LAB — INTRINSIC FACTOR ANTIBODIES: Intrinsic Factor: 31.5 [AU]/ml — ABNORMAL HIGH (ref 0.0–1.1)

## 2024-04-01 LAB — VITAMIN B6: Vitamin B6: 12.8 ug/L (ref 3.4–65.2)

## 2024-04-02 LAB — VITAMIN B1: Vitamin B1 (Thiamine): 75.5 nmol/L (ref 66.5–200.0)

## 2024-04-02 LAB — GLIA (IGA/G) + TTG IGA
Antigliadin Abs, IgA: 11 U (ref 0–19)
Gliadin IgG: 2 U (ref 0–19)
Tissue Transglutaminase Ab, IgA: <2 U/mL (ref 0–3)

## 2024-04-02 LAB — METHYLMALONIC ACID, SERUM: Methylmalonic Acid, Quantitative: 2881 nmol/L — ABNORMAL HIGH (ref 0–378)

## 2024-04-05 ENCOUNTER — Ambulatory Visit: Payer: Self-pay | Admitting: Family Medicine

## 2024-04-05 NOTE — Telephone Encounter (Signed)
 Called with results. Relayed positive intrinsic factor antibody. Reports he reviewed with PCP and determining next steps. All questions answered.  Suzann Daring, MD  Family Medicine Teaching Service

## 2024-04-07 ENCOUNTER — Encounter: Payer: Self-pay | Admitting: Physical Therapy

## 2024-04-07 ENCOUNTER — Ambulatory Visit: Attending: Family Medicine | Admitting: Physical Therapy

## 2024-04-07 DIAGNOSIS — E538 Deficiency of other specified B group vitamins: Secondary | ICD-10-CM | POA: Insufficient documentation

## 2024-04-07 DIAGNOSIS — R202 Paresthesia of skin: Secondary | ICD-10-CM | POA: Insufficient documentation

## 2024-04-07 DIAGNOSIS — R2681 Unsteadiness on feet: Secondary | ICD-10-CM | POA: Diagnosis present

## 2024-04-07 DIAGNOSIS — R2689 Other abnormalities of gait and mobility: Secondary | ICD-10-CM | POA: Insufficient documentation

## 2024-04-07 DIAGNOSIS — R296 Repeated falls: Secondary | ICD-10-CM | POA: Diagnosis present

## 2024-04-07 DIAGNOSIS — R208 Other disturbances of skin sensation: Secondary | ICD-10-CM | POA: Diagnosis present

## 2024-04-07 DIAGNOSIS — M6281 Muscle weakness (generalized): Secondary | ICD-10-CM | POA: Insufficient documentation

## 2024-04-07 NOTE — Therapy (Signed)
 OUTPATIENT PHYSICAL THERAPY NEURO EVALUATION   Patient Name: Roberto Sims MRN: [id number] DOB:[birth year], 28 y.o., male Today's Date: 04/07/2024   PCP: Merideth Mabel  REFERRING PROVIDER: Delores Suzann HERO, MD  END OF SESSION:  PT End of Session - 04/07/24 0809     Visit Number 1    Number of Visits 13   Plus eval   Date for Recertification  05/26/24    Authorization Type [employer]    Authorization - Number of Visits 23    PT Start Time 0807   Pt arrived late   PT Stop Time 0835    PT Time Calculation (min) 28 min    Equipment Utilized During Treatment Gait belt    Activity Tolerance Patient tolerated treatment well    Behavior During Therapy WFL for tasks assessed/performed          Past Medical History:  Diagnosis Date   Respiratory failure, acute (HCC) 05/05/2018   Past Surgical History:  Procedure Laterality Date   boxers fracture Right    great toe fracture Right 2008   Fx. 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) 03/28/2024   Chronic health problem 03/28/2024   Polysubstance use disorder 03/28/2024   Substance induced mood disorder (HCC)    Acute respiratory failure with hypoxia and hypercapnia (HCC) 05/08/2018   Overdose, accidental or unintentional, initial encounter 05/08/2018   Anxiety 05/08/2018   Aspiration pneumonia (HCC) 05/08/2018   Transaminitis 05/08/2018    ONSET DATE: 03/29/2024 (referral)   REFERRING DIAG: R20.2 (ICD-10-CM) - Paresthesias E53.8 (ICD-10-CM) - B12 deficiency  THERAPY DIAG:  Other disturbances of skin sensation  Repeated falls  Muscle weakness (generalized)  Other abnormalities of gait and mobility  Unsteadiness on feet  Rationale for Evaluation and Treatment: Rehabilitation  SUBJECTIVE:                                                                                                                                                                                              SUBJECTIVE STATEMENT: [name]  Pt presents w/rollator. States he fell in the shower this AM, has had a few falls due to not being able to feel his legs. Is numb all over his body, up to his sternum.   Saw Dr. Merideth last week and has follow-up in 4 weeks. 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   Uses the rollator in community, does not use it in  the house. 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    Pt accompanied by: self  PERTINENT HISTORY: substance induced mood disorder, acute respiratory failure do to involuntary drug overdose, anxiety an transaminitis.  PAIN:  Are you having pain? No  PRECAUTIONS: Fall  RED FLAGS: None   WEIGHT BEARING RESTRICTIONS: No  FALLS: Has patient fallen in last 6 months? Yes. 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  DIAGNOSTIC FINDINGS: MRI of Lumbar spine from 03/28/24 IMPRESSION: 1. No evidence of cauda equina syndrome or other acute abnormality.  MRI of brain from 03/28/24 IMPRESSION: 1. No acute intracranial abnormality. 2. No mass or abnormal enhancement.  COGNITION: Overall cognitive status: Within functional limits for tasks assessed   SENSATION: Pt reports numbness in all extremities and up to T4   COORDINATION: Impaired in BLEs, ataxic-like gait w/overshooting and undershooting noted    POSTURE: No Significant postural  limitations  LOWER EXTREMITY MMT:   Tested in seated position   MMT  Right Eval Left Eval  Hip flexion 4 4+  Hip extension    Hip abduction 5 5  Hip adduction 5 5  Hip internal rotation    Hip external rotation    Knee flexion 4- 4-  Knee extension 4 3+  Ankle dorsiflexion 4+ 4+  Ankle plantarflexion    Ankle inversion    Ankle eversion     (Blank rows = not tested)   BED MOBILITY:  Not tested Pt sleeping on couch due to high height of bed, difficult to step out of bed safely   TRANSFERS: Sit to stand: SBA  Assistive device utilized: None     Stand to sit: SBA  Assistive device utilized: None     Pt stands slowly w/weight shift to R side, increased time for immediate standing balance   RAMP:  Not tested  CURB:  Not tested  STAIRS: Not tested GAIT: Gait pattern: Bilateral IR of hips, step through pattern, decreased arm swing- Right, decreased arm swing- Left, decreased stride length, decreased ankle dorsiflexion- Right, decreased ankle dorsiflexion- Left, circumduction- Left, ataxic, lateral hip instability, decreased trunk rotation, and wide BOS Distance walked: Various clinic distances  Assistive device utilized: Environmental consultant - 4 wheeled and None Level of assistance: SBA and CGA Comments: Pt demonstrates increased lateral deviations to L > R and significant inconsistent scissoring/ataxic gait of BLEs without AD.    FUNCTIONAL TESTS:   [facility] PT Assessment - 04/07/24 0821       Transfers   Five time sit to stand comments  19.97s   no UE support, weight shift to R side     Ambulation/Gait   Gait velocity 32.8' over 14.97s = 2.19 ft/s no AD   CGA, very unstable        MCTSIB: Condition 1: Avg of 3 trials: 30 sec, Condition 2: Avg of 3 trials: 27.3 sec (24,28,30 sec), Condition 3: Avg of 3 trials: 30 sec, Condition 4: Avg of 3 trials: 0 sec, and Total Score: 87.3/120  TREATMENT:   Self-care  Discussed importance of using rollator at all times, even at home, for reduced fall risk and improved quality of gait. Pt verbalized understanding and agreement.    PATIENT EDUCATION: Education details: POC, eval findings, encouragement to use AD at all times  Person educated: Patient Education method: Medical illustrator Education comprehension: verbalized understanding and needs further education  HOME EXERCISE PROGRAM: To be established   GOALS: Goals reviewed with patient? Yes  SHORT TERM GOALS: Target date: 05/05/2024   Pt will be independent with initial HEP for improved strength, balance, transfers and gait.  Baseline: not established on eval  Goal status: INITIAL  2.  Pt will improve 5 x STS to less than or equal to 15 seconds w/equal weight shift to demonstrate improved functional strength and transfer efficiency.   Baseline: 19.97s no UE support and shift to R  Goal status: INITIAL  3.  Pt will improve gait velocity to at least 2.5 ft/s w/LRAD and SBA for improved gait efficiency and independence   Baseline: 2.19 ft/s no AD and CGA Goal status: INITIAL  4.  Pt will improve condition 2 of MCTSIB to 30s for improved somatosensory and vestibular input and safety at home  Baseline: 27.33s Goal status: INITIAL  LONG TERM GOALS: Target date: 05/19/2024   Pt will be independent with final HEP for improved strength, balance, transfers and gait.  Baseline:  Goal status: INITIAL  2.  Pt will improve 5 x STS to less than or equal to 12 seconds to demonstrate improved functional strength and transfer efficiency.   Baseline: 19.97s w/no UE support and shift to R  Goal status: INITIAL  3.  Pt will improve gait velocity to at least 2.9 ft/s w/LRAD mod I for improved gait efficiency and reduced fall risk   Baseline: 2.19 ft/s w/no AD and CGA Goal status: INITIAL  4.  Pt will hold condition 4 of MCTSIB to >/=  10s for improved vestibular input on unlevel surfaces Baseline: 0s  Goal status: INITIAL  ASSESSMENT:  CLINICAL IMPRESSION: Patient is a 28 year old male referred to Neuro OPPT for paresthesias. Pt's PMH is significant for: substance induced mood disorder, acute respiratory failure do to involuntary drug overdose, anxiety an transaminitis. The following deficits were present during the exam: impaired sensation, impaired coordination of BLEs, decreased safety awareness, impaired gait kinematics and decreased functional strength. Based on recurrent falls, gait speed and 5x STS, pt is an incr risk for falls. Pt would benefit from skilled PT to address these impairments and functional limitations to maximize functional mobility independence.    OBJECTIVE IMPAIRMENTS: Abnormal gait, decreased activity tolerance, decreased balance, decreased coordination, decreased endurance, decreased knowledge of condition, decreased mobility, difficulty walking, decreased strength, decreased safety awareness, impaired sensation, impaired UE functional use, and improper body mechanics  ACTIVITY LIMITATIONS: carrying, lifting, bending, standing, squatting, stairs, transfers, bed mobility, bathing, hygiene/grooming, locomotion level, and caring for others  PARTICIPATION LIMITATIONS: meal prep, cleaning, interpersonal relationship, driving, shopping, community activity, occupation, and yard work  PERSONAL FACTORS: 1 comorbidity: paresthesias are also affecting patient's functional outcome.   REHAB POTENTIAL: Good  CLINICAL DECISION MAKING: Evolving/moderate complexity  EVALUATION COMPLEXITY: Moderate  PLAN:  PT FREQUENCY: 2x/week  PT DURATION: 6 weeks  PLANNED INTERVENTIONS: 97164- PT Re-evaluation, 97750- Physical Performance Testing, 97110-Therapeutic exercises, 97530- Therapeutic activity, [id number]- Neuromuscular re-education, 97535- Self Care, 02859- Manual therapy, [id number]- Gait training, [id number]- Orthotic  Initial, [id number]- Orthotic/Prosthetic subsequent, [id number]- Aquatic Therapy, [phone]- Electrical stimulation (manual), [phone] (  1-2 muscles), 20561 (3+ muscles)- Dry Needling, Patient/Family education, Balance training, Stair training, Joint mobilization, Spinal mobilization, Vestibular training, and DME instructions  PLAN FOR NEXT SESSION: strength training for BLEs, maybe use Bioness for biofeedback? LE coordination. HEP for improved strength, endurance, ankle strategy and vestibular input    Alicyn Klann E Artavis Cowie, PT, DPT 04/07/2024, 9:05 AM

## 2024-04-12 ENCOUNTER — Ambulatory Visit

## 2024-04-12 DIAGNOSIS — R296 Repeated falls: Secondary | ICD-10-CM

## 2024-04-12 DIAGNOSIS — R208 Other disturbances of skin sensation: Secondary | ICD-10-CM

## 2024-04-12 DIAGNOSIS — R2689 Other abnormalities of gait and mobility: Secondary | ICD-10-CM

## 2024-04-12 DIAGNOSIS — M6281 Muscle weakness (generalized): Secondary | ICD-10-CM

## 2024-04-12 DIAGNOSIS — R2681 Unsteadiness on feet: Secondary | ICD-10-CM

## 2024-04-12 NOTE — Therapy (Signed)
 OUTPATIENT PHYSICAL THERAPY NEURO TREATMENT   Patient Name: Roberto Sims MRN: [id number] DOB:[birth year], 28 y.o., male Today's Date: 04/12/2024   PCP: Merideth Mabel  REFERRING PROVIDER: Delores Suzann HERO, MD  END OF SESSION:  PT End of Session - 04/12/24 0922     Visit Number 2    Number of Visits 13    Date for Recertification  05/26/24    Authorization Type [employer]    Authorization - Number of Visits 23    PT Start Time 0930    PT Stop Time 1010    PT Time Calculation (min) 40 min    Equipment Utilized During Treatment Gait belt    Activity Tolerance Patient tolerated treatment well    Behavior During Therapy WFL for tasks assessed/performed          Past Medical History:  Diagnosis Date   Respiratory failure, acute (HCC) 05/05/2018   Past Surgical History:  Procedure Laterality Date   boxers fracture Right    great toe fracture Right 2008   Fx. 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) 03/28/2024   Chronic health problem 03/28/2024   Polysubstance use disorder 03/28/2024   Substance induced mood disorder (HCC)    Acute respiratory failure with hypoxia and hypercapnia (HCC) 05/08/2018   Overdose, accidental or unintentional, initial encounter 05/08/2018   Anxiety 05/08/2018   Aspiration pneumonia (HCC) 05/08/2018   Transaminitis 05/08/2018    ONSET DATE: 03/29/2024 (referral)   REFERRING DIAG: R20.2 (ICD-10-CM) - Paresthesias E53.8 (ICD-10-CM) - B12 deficiency  THERAPY DIAG:  Other disturbances of skin sensation  Repeated falls  Muscle weakness (generalized)  Other abnormalities of gait and mobility  Unsteadiness on feet  Rationale for Evaluation and Treatment: Rehabilitation  SUBJECTIVE:                                                                                                                                                                                              SUBJECTIVE STATEMENT: Roberto Sims Patient arrives to clinic alone, using rollator. [place] roll[place]. Denies falls.    Pt accompanied by: self  PERTINENT HISTORY: substance induced mood disorder, acute respiratory failure do to involuntary drug overdose, anxiety an transaminitis.  PAIN:  Are you having pain? No  PRECAUTIONS: Fall  PATIENT GOALS: Make it easier to walk   OBJECTIVE:  Note: Objective measures were completed at Evaluation unless otherwise noted.  DIAGNOSTIC FINDINGS: MRI of  Lumbar spine from 03/28/24 IMPRESSION: 1. No evidence of cauda equina syndrome or other acute abnormality.  MRI of brain from 03/28/24 IMPRESSION: 1. No acute intracranial abnormality. 2. No mass or abnormal enhancement.  COGNITION: Overall cognitive status: Within functional limits for tasks assessed   SENSATION: Pt reports numbness in all extremities and up to T4    GAIT: Gait pattern: Bilateral IR of hips, step through pattern, decreased arm swing- Right, decreased arm swing- Left, decreased stride length, decreased ankle dorsiflexion- Right, decreased ankle dorsiflexion- Left, circumduction- Left, ataxic, lateral hip instability, decreased trunk rotation, and wide BOS Distance walked: Various clinic distances  Assistive device utilized: Walker - 4 wheeled and None Level of assistance: SBA and CGA Comments: Pt demonstrates increased lateral deviations to L > R and significant inconsistent scissoring/ataxic gait of BLEs without AD.    FUNCTIONAL TESTS:    MCTSIB: Condition 1: Avg of 3 trials: 30 sec, Condition 2: Avg of 3 trials: 27.3 sec (24,28,30 sec), Condition 3: Avg of 3 trials: 30 sec, Condition 4: Avg of 3 trials: 0 sec, and Total Score: 87.3/120                                                                                                                               TREATMENT:   NMR: -scifit hills level 4x8 mins B LE only for neural priming -initial HEP (see below)  -seated ankle AROM on wobble board coordination -seated ankle alphabet coordination   PATIENT EDUCATION: Education details: initial HEP  Person educated: Patient Education method: Medical illustrator Education comprehension: verbalized understanding and needs further education  HOME EXERCISE PROGRAM: Access Code: [email or web address] URL: [email or web address] Date: 04/12/2024 Prepared by: Delon Pop  Exercises - Feet Together Balance at Counter Top Eyes Closed  - 1 x daily - 7 x weekly - 3 sets - 30s hold - Semi-Tandem Balance at Counter Top Eyes Closed  - 1 x daily - 7 x weekly - 3 sets - 30s hold - Heel Raises with Counter Support  - 1 x daily - 7 x weekly - 3 sets - 10 reps - Single Leg Balance with Opposite Leg Star Reach  - 1 x daily - 7 x weekly - 3 sets - 5 reps - Mini Squat with Counter Support  - 1 x daily - 7 x weekly - 3 sets - 10 reps - Tandem Walking with Counter Support  - 1 x daily - 7 x weekly - 3 sets - 10 reps - Backward Tandem Walking with Counter Support  - 1 x daily - 7 x weekly - 3 sets - 10 reps - Forward and Backward Monster Walk with Resistance at Ankles and Counter Support  - 1 x daily - 7 x weekly - 3 sets - 10 reps  GOALS: Goals reviewed with patient? Yes  SHORT TERM GOALS: Target date: 05/05/2024   Pt will be independent with initial HEP for improved strength, balance, transfers  and gait.  Baseline: not established on eval  Goal status: INITIAL  2.  Pt will improve 5 x STS to less than or equal to 15 seconds w/equal weight shift to demonstrate improved functional strength and transfer efficiency.   Baseline: 19.97s no UE support and shift to R  Goal status: INITIAL  3.  Pt will improve gait velocity to at least 2.5 ft/s w/LRAD and SBA for improved gait efficiency and independence   Baseline: 2.19 ft/s no AD and CGA Goal  status: INITIAL  4.  Pt will improve condition 2 of MCTSIB to 30s for improved somatosensory and vestibular input and safety at home  Baseline: 27.33s Goal status: INITIAL  LONG TERM GOALS: Target date: 05/19/2024   Pt will be independent with final HEP for improved strength, balance, transfers and gait.  Baseline:  Goal status: INITIAL  2.  Pt will improve 5 x STS to less than or equal to 12 seconds to demonstrate improved functional strength and transfer efficiency.   Baseline: 19.97s w/no UE support and shift to R  Goal status: INITIAL  3.  Pt will improve gait velocity to at least 2.9 ft/s w/LRAD mod I for improved gait efficiency and reduced fall risk   Baseline: 2.19 ft/s w/no AD and CGA Goal status: INITIAL  4.  Pt will hold condition 4 of MCTSIB to >/= 10s for improved vestibular input on unlevel surfaces Baseline: 0s  Goal status: INITIAL  ASSESSMENT:  CLINICAL IMPRESSION: Patient seen for skilled PT session with emphasis on establishing initial HEP. With fatigue, gait became increasingly wide based with decreasing foot clearance. NBOS and EC tasks were most challenging for patient with increased need for UE support noted. Tendency to favor R LE with subtle weight shift requiring external cuing to obtain midline. Continue POC.    OBJECTIVE IMPAIRMENTS: Abnormal gait, decreased activity tolerance, decreased balance, decreased coordination, decreased endurance, decreased knowledge of condition, decreased mobility, difficulty walking, decreased strength, decreased safety awareness, impaired sensation, impaired UE functional use, and improper body mechanics  ACTIVITY LIMITATIONS: carrying, lifting, bending, standing, squatting, stairs, transfers, bed mobility, bathing, hygiene/grooming, locomotion level, and caring for others  PARTICIPATION LIMITATIONS: meal prep, cleaning, interpersonal relationship, driving, shopping, community activity, occupation, and yard  work  PERSONAL FACTORS: 1 comorbidity: paresthesias are also affecting patient's functional outcome.   REHAB POTENTIAL: Good  CLINICAL DECISION MAKING: Evolving/moderate complexity  EVALUATION COMPLEXITY: Moderate  PLAN:  PT FREQUENCY: 2x/week  PT DURATION: 6 weeks  PLANNED INTERVENTIONS: 97164- PT Re-evaluation, 97750- Physical Performance Testing, 97110-Therapeutic exercises, 97530- Therapeutic activity, [id number]- Neuromuscular re-education, 97535- Self Care, [id number]- Manual therapy, [id number]- Gait training, [phone]- Orthotic Initial, [phone]- Orthotic/Prosthetic subsequent, [phone]- Aquatic Therapy, [phone]- Electrical stimulation (manual), [phone] (1-2 muscles), [phone] (3+ muscles)- Dry Needling, Patient/Family education, Balance training, Stair training, Joint mobilization, Spinal mobilization, Vestibular training, and DME instructions  PLAN FOR NEXT SESSION: strength training for BLEs, maybe use Bioness for biofeedback? LE coordination. Surge, blaze pods on foam surface, treadmill for gait endurance?, sit <> stand on rockerboard    Delon DELENA Pop, PT Delon DELENA Pop, PT, DPT, CBIS  04/12/2024, 10:22 AM

## 2024-04-14 ENCOUNTER — Ambulatory Visit: Attending: Family Medicine

## 2024-04-14 DIAGNOSIS — R296 Repeated falls: Secondary | ICD-10-CM | POA: Insufficient documentation

## 2024-04-14 DIAGNOSIS — R208 Other disturbances of skin sensation: Secondary | ICD-10-CM | POA: Diagnosis present

## 2024-04-14 DIAGNOSIS — R2689 Other abnormalities of gait and mobility: Secondary | ICD-10-CM | POA: Insufficient documentation

## 2024-04-14 DIAGNOSIS — M6281 Muscle weakness (generalized): Secondary | ICD-10-CM | POA: Insufficient documentation

## 2024-04-14 DIAGNOSIS — R2681 Unsteadiness on feet: Secondary | ICD-10-CM | POA: Insufficient documentation

## 2024-04-14 NOTE — Therapy (Signed)
 OUTPATIENT PHYSICAL THERAPY NEURO TREATMENT   Patient Name: Roberto Sims MRN: [id number] DOB:Dec 30, 1995, 28 y.o., male Today's Date: 04/14/2024   PCP: Merideth Mabel  REFERRING PROVIDER: Delores Suzann HERO, MD  END OF SESSION:  PT End of Session - 04/14/24 0802     Visit Number 3    Number of Visits 13    Date for Recertification  05/26/24    Authorization Type [employer]    Authorization - Number of Visits 23    PT Start Time 0801    PT Stop Time 0839    PT Time Calculation (min) 38 min    Equipment Utilized During Treatment Gait belt    Activity Tolerance Patient tolerated treatment well    Behavior During Therapy WFL for tasks assessed/performed          Past Medical History:  Diagnosis Date   Respiratory failure, acute (HCC) 05/05/2018   Past Surgical History:  Procedure Laterality Date   boxers fracture Right    great toe fracture Right 2008   Fx. 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) 03/28/2024   Chronic health problem 03/28/2024   Polysubstance use disorder 03/28/2024   Substance induced mood disorder (HCC)    Acute respiratory failure with hypoxia and hypercapnia (HCC) 05/08/2018   Overdose, accidental or unintentional, initial encounter 05/08/2018   Anxiety 05/08/2018   Aspiration pneumonia (HCC) 05/08/2018   Transaminitis 05/08/2018    ONSET DATE: 03/29/2024 (referral)   REFERRING DIAG: R20.2 (ICD-10-CM) - Paresthesias E53.8 (ICD-10-CM) - B12 deficiency  THERAPY DIAG:  Muscle weakness (generalized)  Other abnormalities of gait and mobility  Unsteadiness on feet  Other disturbances of skin sensation  Rationale for Evaluation and Treatment: Rehabilitation  SUBJECTIVE:                                                                                                                                                                                              SUBJECTIVE STATEMENT: Roberto Sims Patient arrives to clinic alone, not using rollator. Has been doing the exercises and tolerating well. Denies falls. Continues to endorse difficulty with balance.    Pt accompanied by: self  PERTINENT HISTORY: substance induced mood disorder, acute respiratory failure do to involuntary drug overdose, anxiety an transaminitis.  PAIN:  Are you having pain? No  PRECAUTIONS: Fall  PATIENT GOALS: Make it easier to walk   OBJECTIVE:  Note: Objective measures were completed at Evaluation unless otherwise noted.  DIAGNOSTIC FINDINGS: MRI of Lumbar spine from 03/28/24 IMPRESSION: 1. No evidence of cauda equina syndrome or other acute abnormality.  MRI of brain from 03/28/24 IMPRESSION: 1. No acute intracranial abnormality. 2. No mass or abnormal enhancement.  COGNITION: Overall cognitive status: Within functional limits for tasks assessed   SENSATION: Numbness in distal B UE and B LE    GAIT: Gait pattern: Bilateral IR of hips, step through pattern, decreased arm swing- Right, decreased arm swing- Left, decreased stride length, decreased ankle dorsiflexion- Right, decreased ankle dorsiflexion- Left, circumduction- Left, ataxic, lateral hip instability, decreased trunk rotation, and wide BOS Distance walked: Various clinic distances  Assistive device utilized: Walker - 4 wheeled and None Level of assistance: SBA and CGA Comments: Pt demonstrates increased lateral deviations to L > R and significant inconsistent scissoring/ataxic gait of BLEs without AD.    FUNCTIONAL TESTS:    MCTSIB: Condition 1: Avg of 3 trials: 30 sec, Condition 2: Avg of 3 trials: 27.3 sec (24,28,30 sec), Condition 3: Avg of 3 trials: 30 sec, Condition 4: Avg of 3 trials: 0 sec, and Total Score: 87.3/120                                                                                                                               TREATMENT:   NMR: -blaze pods on foam balance beam in // bars   -lateral stepping-> tandem -standing on foam balance beam ball toss   -> dual cog task    -increased challenge with dual co task, prioritizing motor task  -sit <> stand in A/P rockerboard   -habituated well to equal weight distribution with no LOB and appropriate ankle strategy  -deadlift with Surge   -2x8, initial VC for correct form  -230' carrying Surge   -appropriate gait speed, increased emphasis on heel strike, WBOS, slight L medial heel whip  -scifit hills level 5 x5 mins for dynamic cool down   PATIENT EDUCATION: Education details: continue HEP, defer to PCP for return to driving  Person educated: Patient Education method: Medical illustrator Education comprehension: verbalized understanding and needs further education  HOME EXERCISE PROGRAM: Access Code: [email or web address] URL: [email or web address] Date: 04/12/2024 Prepared by: Delon Pop  Exercises - Feet Together Balance at Counter Top Eyes Closed  - 1 x daily - 7 x weekly - 3 sets - 30s hold - Semi-Tandem Balance at Counter Top Eyes Closed  - 1 x daily - 7 x weekly - 3 sets - 30s hold - Heel Raises with Counter Support  - 1 x daily - 7 x weekly - 3 sets - 10 reps - Single Leg Balance with Opposite Leg Star Reach  - 1 x daily - 7 x weekly - 3 sets - 5 reps - Mini Squat with Counter Support  - 1 x daily - 7 x weekly - 3 sets - 10 reps - Tandem Walking with Counter Support  - 1 x daily - 7  x weekly - 3 sets - 10 reps - Backward Tandem Walking with Counter Support  - 1 x daily - 7 x weekly - 3 sets - 10 reps - Forward and Backward Monster Walk with Resistance at Ankles and Counter Support  - 1 x daily - 7 x weekly - 3 sets - 10 reps  GOALS: Goals reviewed with patient? Yes  SHORT TERM GOALS: Target date: 05/05/2024   Pt will be independent with initial HEP for improved strength, balance, transfers and  gait.  Baseline: not established on eval  Goal status: INITIAL  2.  Pt will improve 5 x STS to less than or equal to 15 seconds w/equal weight shift to demonstrate improved functional strength and transfer efficiency.   Baseline: 19.97s no UE support and shift to R  Goal status: INITIAL  3.  Pt will improve gait velocity to at least 2.5 ft/s w/LRAD and SBA for improved gait efficiency and independence   Baseline: 2.19 ft/s no AD and CGA Goal status: INITIAL  4.  Pt will improve condition 2 of MCTSIB to 30s for improved somatosensory and vestibular input and safety at home  Baseline: 27.33s Goal status: INITIAL  LONG TERM GOALS: Target date: 05/19/2024   Pt will be independent with final HEP for improved strength, balance, transfers and gait.  Baseline:  Goal status: INITIAL  2.  Pt will improve 5 x STS to less than or equal to 12 seconds to demonstrate improved functional strength and transfer efficiency.   Baseline: 19.97s w/no UE support and shift to R  Goal status: INITIAL  3.  Pt will improve gait velocity to at least 2.9 ft/s w/LRAD mod I for improved gait efficiency and reduced fall risk   Baseline: 2.19 ft/s w/no AD and CGA Goal status: INITIAL  4.  Pt will hold condition 4 of MCTSIB to >/= 10s for improved vestibular input on unlevel surfaces Baseline: 0s  Goal status: INITIAL  ASSESSMENT:  CLINICAL IMPRESSION: Patient seen for skilled PT session with emphasis on progressing balance challenges and allowing opportunities for ankle strategy use. He tolerated this well with limited, but appropriate ankle strategy. More challenged by NBOS on compliant surfaces, esp with dual task overlay impacting his ability to visualize his feet for full feedback. Continue POC.    OBJECTIVE IMPAIRMENTS: Abnormal gait, decreased activity tolerance, decreased balance, decreased coordination, decreased endurance, decreased knowledge of condition, decreased mobility, difficulty  walking, decreased strength, decreased safety awareness, impaired sensation, impaired UE functional use, and improper body mechanics  ACTIVITY LIMITATIONS: carrying, lifting, bending, standing, squatting, stairs, transfers, bed mobility, bathing, hygiene/grooming, locomotion level, and caring for others  PARTICIPATION LIMITATIONS: meal prep, cleaning, interpersonal relationship, driving, shopping, community activity, occupation, and yard work  PERSONAL FACTORS: 1 comorbidity: paresthesias are also affecting patient's functional outcome.   REHAB POTENTIAL: Good  CLINICAL DECISION MAKING: Evolving/moderate complexity  EVALUATION COMPLEXITY: Moderate  PLAN:  PT FREQUENCY: 2x/week  PT DURATION: 6 weeks  PLANNED INTERVENTIONS: 97164- PT Re-evaluation, 97750- Physical Performance Testing, 97110-Therapeutic exercises, 97530- Therapeutic activity, [id number]- Neuromuscular re-education, 97535- Self Care, 02859- Manual therapy, [id number]- Gait training, [phone]- Orthotic Initial, [phone]- Orthotic/Prosthetic subsequent, [phone]- Aquatic Therapy, [phone]- Electrical stimulation (manual), [phone] (1-2 muscles), [phone] (3+ muscles)- Dry Needling, Patient/Family education, Balance training, Stair training, Joint mobilization, Spinal mobilization, Vestibular training, and DME instructions  PLAN FOR NEXT SESSION: strength training for BLEs, maybe use Bioness for biofeedback? LE coordination. Surge, blaze pods on foam surface, treadmill for gait endurance?, sit <> stand on  rockerboard, pending progress- early dc?   Delon DELENA Pop, PT Delon DELENA Pop, PT, DPT, CBIS  04/14/2024, 10:05 AM

## 2024-04-15 ENCOUNTER — Inpatient Hospital Stay: Payer: Self-pay

## 2024-04-19 ENCOUNTER — Ambulatory Visit

## 2024-04-19 DIAGNOSIS — R208 Other disturbances of skin sensation: Secondary | ICD-10-CM

## 2024-04-19 DIAGNOSIS — R2689 Other abnormalities of gait and mobility: Secondary | ICD-10-CM

## 2024-04-19 DIAGNOSIS — R2681 Unsteadiness on feet: Secondary | ICD-10-CM

## 2024-04-19 DIAGNOSIS — R296 Repeated falls: Secondary | ICD-10-CM

## 2024-04-19 DIAGNOSIS — M6281 Muscle weakness (generalized): Secondary | ICD-10-CM | POA: Diagnosis not present

## 2024-04-19 NOTE — Therapy (Signed)
 OUTPATIENT PHYSICAL THERAPY NEURO TREATMENT   Patient Name: Roberto Sims MRN: [id number] DOB:10/27/1995, 28 y.o., male Today's Date: 04/19/2024   PCP: Merideth Mabel  REFERRING PROVIDER: Delores Suzann HERO, MD  END OF SESSION:  PT End of Session - 04/19/24 0801     Visit Number 4    Number of Visits 13    Date for Recertification  05/26/24    Authorization Type U[employer]    Authorization - Number of Visits 23    PT Start Time 0801    PT Stop Time 0840    PT Time Calculation (min) 39 min    Activity Tolerance Patient tolerated treatment well    Behavior During Therapy [facility] for tasks assessed/performed          Past Medical History:  Diagnosis Date   Respiratory failure, acute (HCC) 05/05/2018   Past Surgical History:  Procedure Laterality Date   boxers fracture Right    great toe fracture Right 2008   Fx. 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) 03/28/2024   Chronic health problem 03/28/2024   Polysubstance use disorder 03/28/2024   Substance induced mood disorder (HCC)    Acute respiratory failure with hypoxia and hypercapnia (HCC) 05/08/2018   Overdose, accidental or unintentional, initial encounter 05/08/2018   Anxiety 05/08/2018   Aspiration pneumonia (HCC) 05/08/2018   Transaminitis 05/08/2018    ONSET DATE: 03/29/2024 (referral)   REFERRING DIAG: R20.2 (ICD-10-CM) - Paresthesias E53.8 (ICD-10-CM) - B12 deficiency  THERAPY DIAG:  Other abnormalities of gait and mobility  Unsteadiness on feet  Repeated falls  Muscle weakness (generalized)  Other disturbances of skin sensation  Rationale for Evaluation and Treatment: Rehabilitation  SUBJECTIVE:                                                                                                                                                                                              SUBJECTIVE STATEMENT: [name] Patient arrives to clinic alone, no AD. Went to the [place] without the rollator and only had to sit more often, no falls. Reports decrease in grip strength.    Pt accompanied by: self  PERTINENT HISTORY: substance induced mood disorder, acute respiratory failure do to involuntary drug overdose, anxiety an transaminitis.  PAIN:  Are you having pain? No  PRECAUTIONS: Fall  PATIENT GOALS: Make it easier to walk   OBJECTIVE:  Note: Objective measures were completed at Evaluation unless otherwise noted.  DIAGNOSTIC FINDINGS:  MRI of Lumbar spine from 03/28/24 IMPRESSION: 1. No evidence of cauda equina syndrome or other acute abnormality.  MRI of brain from 03/28/24 IMPRESSION: 1. No acute intracranial abnormality. 2. No mass or abnormal enhancement.  COGNITION: Overall cognitive status: Within functional limits for tasks assessed   SENSATION: Numbness in distal B UE and B LE    GAIT: Gait pattern: Bilateral IR of hips, step through pattern, decreased arm swing- Right, decreased arm swing- Left, decreased stride length, decreased ankle dorsiflexion- Right, decreased ankle dorsiflexion- Left, circumduction- Left, ataxic, lateral hip instability, decreased trunk rotation, and wide BOS Distance walked: Various clinic distances  Assistive device utilized: Walker - 4 wheeled and None Level of assistance: SBA and CGA Comments: Pt demonstrates increased lateral deviations to L > R and significant inconsistent scissoring/ataxic gait of BLEs without AD.    FUNCTIONAL TESTS:    MCTSIB: Condition 1: Avg of 3 trials: 30 sec, Condition 2: Avg of 3 trials: 27.3 sec (24,28,30 sec), Condition 3: Avg of 3 trials: 30 sec, Condition 4: Avg of 3 trials: 0 sec, and Total Score: 87.3/120                                                                                                                              TREATMENT:    Therex: -elliptical x5 mins for dynamic LE warm up -R UE grip strength: 105lbs, L UE grip strength: 65lbs  -black grip x10 B UE, 5sec hold  -blue putty grip x5 B UE -finger webs x10 B UE -red dowel supination/pronation x10 reps  -15lb kettlebell dead lifts 2x10 -15lbs kettlebell swings x10 -15lb kettlebell bent over rows x10      PATIENT EDUCATION: Education details: continue HEP, added putty exercises  Person educated: Patient Education method: Medical illustrator Education comprehension: verbalized understanding and needs further education  HOME EXERCISE PROGRAM: Access Code: [email or web address] URL: [email or web address] Date: 04/12/2024 Prepared by: Delon Pop  Exercises - Feet Together Balance at Counter Top Eyes Closed  - 1 x daily - 7 x weekly - 3 sets - 30s hold - Semi-Tandem Balance at Counter Top Eyes Closed  - 1 x daily - 7 x weekly - 3 sets - 30s hold - Heel Raises with Counter Support  - 1 x daily - 7 x weekly - 3 sets - 10 reps - Single Leg Balance with Opposite Leg Star Reach  - 1 x daily - 7 x weekly - 3 sets - 5 reps - Mini Squat with Counter Support  - 1 x daily - 7 x weekly - 3 sets - 10 reps - Tandem Walking with Counter Support  - 1 x daily - 7 x weekly - 3 sets - 10 reps - Backward Tandem Walking with Counter Support  - 1 x daily - 7 x weekly - 3 sets - 10 reps - Forward and Backward Monster Walk with Resistance at Ankles and Counter Support  - 1 x daily -  7 x weekly - 3 sets - 10 reps  GOALS: Goals reviewed with patient? Yes  SHORT TERM GOALS: Target date: 05/05/2024   Pt will be independent with initial HEP for improved strength, balance, transfers and gait.  Baseline: not established on eval  Goal status: INITIAL  2.  Pt will improve 5 x STS to less than or equal to 15 seconds w/equal weight shift to demonstrate improved functional strength and transfer efficiency.   Baseline: 19.97s no UE support and shift to R  Goal  status: INITIAL  3.  Pt will improve gait velocity to at least 2.5 ft/s w/LRAD and SBA for improved gait efficiency and independence   Baseline: 2.19 ft/s no AD and CGA Goal status: INITIAL  4.  Pt will improve condition 2 of MCTSIB to 30s for improved somatosensory and vestibular input and safety at home  Baseline: 27.33s Goal status: INITIAL  LONG TERM GOALS: Target date: 05/19/2024   Pt will be independent with final HEP for improved strength, balance, transfers and gait.  Baseline:  Goal status: INITIAL  2.  Pt will improve 5 x STS to less than or equal to 12 seconds to demonstrate improved functional strength and transfer efficiency.   Baseline: 19.97s w/no UE support and shift to R  Goal status: INITIAL  3.  Pt will improve gait velocity to at least 2.9 ft/s w/LRAD mod I for improved gait efficiency and reduced fall risk   Baseline: 2.19 ft/s w/no AD and CGA Goal status: INITIAL  4.  Pt will hold condition 4 of MCTSIB to >/= 10s for improved vestibular input on unlevel surfaces Baseline: 0s  Goal status: INITIAL  ASSESSMENT:  CLINICAL IMPRESSION: Patient seen for skilled PT session with emphasis on functional grip strength and functional mobility. His R UE grip strength is only slightly less of age-matched norms (120lbs), but his L UE is roughly half of age-matched norms (110lbs). Required Min cues for form on compound movement exercises with more frequent LOB posteriorly. Educated patient on slow return to gym with minimizing overhead lifting. Patient verbalized understanding. Continue POC.    OBJECTIVE IMPAIRMENTS: Abnormal gait, decreased activity tolerance, decreased balance, decreased coordination, decreased endurance, decreased knowledge of condition, decreased mobility, difficulty walking, decreased strength, decreased safety awareness, impaired sensation, impaired UE functional use, and improper body mechanics  ACTIVITY LIMITATIONS: carrying, lifting, bending,  standing, squatting, stairs, transfers, bed mobility, bathing, hygiene/grooming, locomotion level, and caring for others  PARTICIPATION LIMITATIONS: meal prep, cleaning, interpersonal relationship, driving, shopping, community activity, occupation, and yard work  PERSONAL FACTORS: 1 comorbidity: paresthesias are also affecting patient's functional outcome.   REHAB POTENTIAL: Good  CLINICAL DECISION MAKING: Evolving/moderate complexity  EVALUATION COMPLEXITY: Moderate  PLAN:  PT FREQUENCY: 2x/week  PT DURATION: 6 weeks  PLANNED INTERVENTIONS: 97164- PT Re-evaluation, 97750- Physical Performance Testing, 97110-Therapeutic exercises, 97530- Therapeutic activity, [id number]- Neuromuscular re-education, 97535- Self Care, 02859- Manual therapy, [id number]- Gait training, [phone]- Orthotic Initial, [phone]- Orthotic/Prosthetic subsequent, [phone]- Aquatic Therapy, [phone]- Electrical stimulation (manual), [phone] (1-2 muscles), [id number] (3+ muscles)- Dry Needling, Patient/Family education, Balance training, Stair training, Joint mobilization, Spinal mobilization, Vestibular training, and DME instructions  PLAN FOR NEXT SESSION: LE coordination. Surge, blaze pods on foam surface, treadmill for gait endurance?, sit <> stand on rockerboard, pending progress- early dc? Compound exercises with increased weight (dead lifts, kettlebell swings, lunges, etc) did he return to work? Did he get back into his home gym?   Delon DELENA Pop, PT Delon DELENA Pop, PT, DPT, CBIS  04/19/2024, 9:23 AM

## 2024-04-21 ENCOUNTER — Ambulatory Visit: Admitting: Physical Therapy

## 2024-04-21 DIAGNOSIS — M6281 Muscle weakness (generalized): Secondary | ICD-10-CM | POA: Diagnosis not present

## 2024-04-21 DIAGNOSIS — R2681 Unsteadiness on feet: Secondary | ICD-10-CM

## 2024-04-21 DIAGNOSIS — R2689 Other abnormalities of gait and mobility: Secondary | ICD-10-CM

## 2024-04-21 NOTE — Therapy (Signed)
 OUTPATIENT PHYSICAL THERAPY NEURO TREATMENT   Patient Name: Roberto Sims MRN: [id number] DOB:[birth year], 28 y.o., male Today's Date: 04/21/2024   PCP: Merideth Mabel  REFERRING PROVIDER: Delores Suzann HERO, MD  END OF SESSION:  PT End of Session - 04/21/24 0811     Visit Number 5    Number of Visits 13    Date for Recertification  05/26/24    Authorization Type UHC    Authorization - Number of Visits 23    PT Start Time 0810   Pt arrived late   PT Stop Time 0848    PT Time Calculation (min) 38 min    Activity Tolerance Patient tolerated treatment well    Behavior During Therapy [facility] for tasks assessed/performed           Past Medical History:  Diagnosis Date   Respiratory failure, acute (HCC) 05/05/2018   Past Surgical History:  Procedure Laterality Date   boxers fracture Right    great toe fracture Right 2008   Fx. 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) 03/28/2024   Chronic health problem 03/28/2024   Polysubstance use disorder 03/28/2024   Substance induced mood disorder (HCC)    Acute respiratory failure with hypoxia and hypercapnia (HCC) 05/08/2018   Overdose, accidental or unintentional, initial encounter 05/08/2018   Anxiety 05/08/2018   Aspiration pneumonia (HCC) 05/08/2018   Transaminitis 05/08/2018    ONSET DATE: 03/29/2024 (referral)   REFERRING DIAG: R20.2 (ICD-10-CM) - Paresthesias E53.8 (ICD-10-CM) - B12 deficiency  THERAPY DIAG:  Other abnormalities of gait and mobility  Unsteadiness on feet  Muscle weakness (generalized)  Rationale for Evaluation and Treatment: Rehabilitation  SUBJECTIVE:                                                                                                                                                                                              SUBJECTIVE STATEMENT: Roberto Sims  Patient arrives to clinic alone, no AD. States he worked out in his home gym, went well. Grip is still not 100%. Has not worked on his legs, does not feel safe doing this without a rack at home to hold onto   Pt accompanied by: self  PERTINENT HISTORY: substance induced mood disorder, acute respiratory failure do to involuntary drug overdose, anxiety an transaminitis.  PAIN:  Are you having pain? No  PRECAUTIONS: Fall  PATIENT GOALS: Make it easier to walk   OBJECTIVE:  Note: Objective measures  were completed at Evaluation unless otherwise noted.  DIAGNOSTIC FINDINGS: MRI of Lumbar spine from 03/28/24 IMPRESSION: 1. No evidence of cauda equina syndrome or other acute abnormality.  MRI of brain from 03/28/24 IMPRESSION: 1. No acute intracranial abnormality. 2. No mass or abnormal enhancement.  COGNITION: Overall cognitive status: Within functional limits for tasks assessed   SENSATION: Numbness in distal B UE and B LE    GAIT: Gait pattern: Bilateral IR of hips, step through pattern, decreased arm swing- Right, decreased arm swing- Left, decreased stride length, decreased ankle dorsiflexion- Right, decreased ankle dorsiflexion- Left, circumduction- Left, ataxic, lateral hip instability, decreased trunk rotation, and wide BOS Distance walked: Various clinic distances  Assistive device utilized: Walker - 4 wheeled and None Level of assistance: SBA and CGA Comments: Pt demonstrates increased lateral deviations to L > R and significant inconsistent scissoring/ataxic gait of BLEs without AD.    FUNCTIONAL TESTS:    MCTSIB: Condition 1: Avg of 3 trials: 30 sec, Condition 2: Avg of 3 trials: 27.3 sec (24,28,30 sec), Condition 3: Avg of 3 trials: 30 sec, Condition 4: Avg of 3 trials: 0 sec, and Total Score: 87.3/120                                                                                                                               TREATMENT:   Ther Act  The following were performed for improved quad strength, single leg stability, core stability, facilitation of hip hinge and grip strength:  Heel elevated goblet squats w/15# KB, x20 reps Suitcase carries w/25# KB, x230' per side. Increased instability when holding weight in LUE.  Front rack surge carries, x230'  Bell-up KB holds in front rack position w/10# KB in single arm, x60' each UE. Increased difficulty in LUE  Reviewed KB swings w/15# KB x20 reps, max cues to facilitate hip hinge rather than use arms to perform. Added to HEP (see bolded below)  Progressed to alt single arm KB swings w/15# KB, x10 reps per side.  At ballet bar, alt reverse lunges w/BUE support. Pt unable to stabilize without UE support. Educated pt on performing curtsy lunges at home as well and provided handout of this (see HEP below)  Eccentric heel taps from 6 step w/BUE support, x14 reps per side. Pt able to perform 2-3 reps per side without UE support, but fatigued in LLE very quickly and was more stable w/BUE support.      PATIENT EDUCATION: Education details: Updates to HEP Person educated: Patient Education method: Programmer, multimedia, Facilities manager, Verbal cues, and Handouts Education comprehension: verbalized understanding, returned demonstration, verbal cues required, and needs further education  HOME EXERCISE PROGRAM: Access Code: [email or web address] URL: [email or web address] Date: 04/12/2024 Prepared by: Delon Pop  Exercises - Feet Together Balance at Counter Top Eyes Closed  - 1 x daily - 7 x weekly - 3 sets - 30s hold - Semi-Tandem Balance at Counter Top Eyes Closed  - 1 x daily -  7 x weekly - 3 sets - 30s hold - Heel Raises with Counter Support  - 1 x daily - 7 x weekly - 3 sets - 10 reps - Single Leg Balance with Opposite Leg Star Reach  - 1 x daily - 7 x weekly - 3 sets - 5 reps - Mini Squat with Counter Support  - 1 x daily - 7 x weekly - 3 sets - 10 reps - Tandem  Walking with Counter Support  - 1 x daily - 7 x weekly - 3 sets - 10 reps - Backward Tandem Walking with Counter Support  - 1 x daily - 7 x weekly - 3 sets - 10 reps - Forward and Backward Monster Walk with Resistance at Ankles and Counter Support  - 1 x daily - 7 x weekly - 3 sets - 10 reps - Kettlebell Swing  - 1 x daily - 7 x weekly - 3 sets - 10 reps - Reverse Lunge  - 1 x daily - 7 x weekly - 3 sets - 10 reps - Curtsy lunge   - 1 x daily - 7 x weekly - 3 sets - 10 reps  GOALS: Goals reviewed with patient? Yes  SHORT TERM GOALS: Target date: 05/05/2024   Pt will be independent with initial HEP for improved strength, balance, transfers and gait.  Baseline: not established on eval  Goal status: INITIAL  2.  Pt will improve 5 x STS to less than or equal to 15 seconds w/equal weight shift to demonstrate improved functional strength and transfer efficiency.   Baseline: 19.97s no UE support and shift to R  Goal status: INITIAL  3.  Pt will improve gait velocity to at least 2.5 ft/s w/LRAD and SBA for improved gait efficiency and independence   Baseline: 2.19 ft/s no AD and CGA Goal status: INITIAL  4.  Pt will improve condition 2 of MCTSIB to 30s for improved somatosensory and vestibular input and safety at home  Baseline: 27.33s Goal status: INITIAL  LONG TERM GOALS: Target date: 05/19/2024   Pt will be independent with final HEP for improved strength, balance, transfers and gait.  Baseline:  Goal status: INITIAL  2.  Pt will improve 5 x STS to less than or equal to 12 seconds to demonstrate improved functional strength and transfer efficiency.   Baseline: 19.97s w/no UE support and shift to R  Goal status: INITIAL  3.  Pt will improve gait velocity to at least 2.9 ft/s w/LRAD mod I for improved gait efficiency and reduced fall risk   Baseline: 2.19 ft/s w/no AD and CGA Goal status: INITIAL  4.  Pt will hold condition 4 of MCTSIB to >/= 10s for improved vestibular  input on unlevel surfaces Baseline: 0s  Goal status: INITIAL  ASSESSMENT:  CLINICAL IMPRESSION: Emphasis of skilled PT session on continued improvement of grip strength, BLE strength and functional stability. Pt reports he has returned to lifting in his home gym but has not returned to [employer] as he does not feel as stable doing standing lifts. Reviewed KB swings and added lunges to HEP today as these continue to be a challenge for pt's grip strength and stability. Pt overall doing well but would continue to benefit from skilled PT services to improve functional strength, reacting balance and reduce fall risk. Continue POC.    OBJECTIVE IMPAIRMENTS: Abnormal gait, decreased activity tolerance, decreased balance, decreased coordination, decreased endurance, decreased knowledge of condition, decreased mobility, difficulty walking, decreased  strength, decreased safety awareness, impaired sensation, impaired UE functional use, and improper body mechanics  ACTIVITY LIMITATIONS: carrying, lifting, bending, standing, squatting, stairs, transfers, bed mobility, bathing, hygiene/grooming, locomotion level, and caring for others  PARTICIPATION LIMITATIONS: meal prep, cleaning, interpersonal relationship, driving, shopping, community activity, occupation, and yard work  PERSONAL FACTORS: 1 comorbidity: paresthesias are also affecting patient's functional outcome.   REHAB POTENTIAL: Good  CLINICAL DECISION MAKING: Evolving/moderate complexity  EVALUATION COMPLEXITY: Moderate  PLAN:  PT FREQUENCY: 2x/week  PT DURATION: 6 weeks  PLANNED INTERVENTIONS: 97164- PT Re-evaluation, 97750- Physical Performance Testing, 97110-Therapeutic exercises, 97530- Therapeutic activity, [id number]- Neuromuscular re-education, 97535- Self Care, 02859- Manual therapy, [id number]- Gait training, [phone]- Orthotic Initial, [phone]- Orthotic/Prosthetic subsequent, [phone]- Aquatic Therapy, [phone]- Electrical stimulation (manual),  [phone] (1-2 muscles), [phone] (3+ muscles)- Dry Needling, Patient/Family education, Balance training, Stair training, Joint mobilization, Spinal mobilization, Vestibular training, and DME instructions  PLAN FOR NEXT SESSION: LE coordination. Surge, blaze pods on foam surface, treadmill for gait endurance?, sit <> stand on rockerboard, pending progress- early dc? Compound exercises with increased weight (dead lifts, kettlebell swings, lunges, etc) did he return to work? Did he get back into his home gym? Slam balls, standing marches w/OH hold   Yosmar Ryker E Edie Vallandingham, PT, DPT  04/21/2024, 8:49 AM

## 2024-04-26 ENCOUNTER — Ambulatory Visit: Admitting: Physical Therapy

## 2024-04-28 ENCOUNTER — Ambulatory Visit: Admitting: Physical Therapy

## 2024-04-28 DIAGNOSIS — R208 Other disturbances of skin sensation: Secondary | ICD-10-CM

## 2024-04-28 DIAGNOSIS — R2689 Other abnormalities of gait and mobility: Secondary | ICD-10-CM

## 2024-04-28 DIAGNOSIS — M6281 Muscle weakness (generalized): Secondary | ICD-10-CM

## 2024-04-28 DIAGNOSIS — R2681 Unsteadiness on feet: Secondary | ICD-10-CM

## 2024-04-28 NOTE — Therapy (Signed)
 OUTPATIENT PHYSICAL THERAPY NEURO TREATMENT - DISCHARGE SUMMARY    Patient Name: Roberto Sims MRN: [id number] DOB:06/06/1996, 28 y.o., male Today's Date: 04/28/2024   PCP: Merideth Mabel  REFERRING PROVIDER: Delores Suzann HERO, MD  PHYSICAL THERAPY DISCHARGE SUMMARY  Visits from Start of Care: 6  Current functional level related to goals / functional outcomes: Mod I w/all ADLs, driving up to 15 minutes    Remaining deficits: Impaired sensation in BLEs   Education / Equipment: HEP   Patient agrees to discharge. Patient goals were met. Patient is being discharged due to meeting the stated rehab goals.   END OF SESSION:  PT End of Session - 04/28/24 0802     Visit Number 6    Number of Visits 13    Date for Recertification  05/26/24    Authorization Type [employer]    Authorization - Number of Visits 23    PT Start Time 0801    PT Stop Time 0814   DC   PT Time Calculation (min) 13 min    Activity Tolerance Patient tolerated treatment well    Behavior During Therapy WFL for tasks assessed/performed           Past Medical History:  Diagnosis Date   Respiratory failure, acute (HCC) 05/05/2018   Past Surgical History:  Procedure Laterality Date   boxers fracture Right    great toe fracture Right 2008   Fx. 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) 03/28/2024   Chronic health problem 03/28/2024   Polysubstance use disorder 03/28/2024   Substance induced mood disorder (HCC)    Acute respiratory failure with hypoxia and hypercapnia (HCC) 05/08/2018   Overdose, accidental or unintentional, initial encounter 05/08/2018   Anxiety 05/08/2018   Aspiration pneumonia (HCC) 05/08/2018   Transaminitis 05/08/2018    ONSET DATE: 03/29/2024 (referral)   REFERRING DIAG: R20.2 (ICD-10-CM) - Paresthesias E53.8  (ICD-10-CM) - B12 deficiency  THERAPY DIAG:  Other abnormalities of gait and mobility  Unsteadiness on feet  Muscle weakness (generalized)  Other disturbances of skin sensation  Rationale for Evaluation and Treatment: Rehabilitation  SUBJECTIVE:                                                                                                                                                                                             SUBJECTIVE STATEMENT: Roberto Sims  Patient arrives to clinic alone, drove himself today. States his physician cleared him to drive up  to 15 minutes. Feels like his sensation is returning, moving more distally in his legs. Has returned to work full time, started yesterday and walked 3x what he normally does. Feels ready to DC today.    Pt accompanied by: self  PERTINENT HISTORY: substance induced mood disorder, acute respiratory failure do to involuntary drug overdose, anxiety an transaminitis.  PAIN:  Are you having pain? No  PRECAUTIONS: Fall  PATIENT GOALS: Make it easier to walk   OBJECTIVE:  Note: Objective measures were completed at Evaluation unless otherwise noted.  DIAGNOSTIC FINDINGS: MRI of Lumbar spine from 03/28/24 IMPRESSION: 1. No evidence of cauda equina syndrome or other acute abnormality.  MRI of brain from 03/28/24 IMPRESSION: 1. No acute intracranial abnormality. 2. No mass or abnormal enhancement.  COGNITION: Overall cognitive status: Within functional limits for tasks assessed   SENSATION: Numbness in distal B UE and B LE    GAIT: Gait pattern: Bilateral IR of hips, step through pattern, decreased ankle dorsiflexion- Right, decreased ankle dorsiflexion- Left, lateral hip instability, and wide BOS Distance walked: Various clinic distances  Assistive device utilized: None Level of assistance: Modified independence Comments: No instability noted, increased IR of RLE    FUNCTIONAL TESTS:   [facility] PT Assessment - 04/28/24  0806       Transfers   Five time sit to stand comments  9.16s   no UE support, equal weight shift     Ambulation/Gait   Gait velocity 32.8' over 7.97s = 4.12 ft/s   no AD                                                                                                                                     TREATMENT:   Ther Act   [facility] PT Assessment - 04/28/24 0806       Transfers   Five time sit to stand comments  9.16s   no UE support, equal weight shift     Ambulation/Gait   Gait velocity 32.8' over 7.97s = 4.12 ft/s   no AD          MCTSIB: Condition 2: Avg of 3 trials: 30 sec, Condition 4: Avg of 3 trials: 30 sec   Discussed importance of continuing HEP post-DC     PATIENT EDUCATION: Education details: Continue HEP, how to return to PT in future if mobility needs change  Person educated: Patient Education method: Explanation, Demonstration, and Verbal cues Education comprehension: verbalized understanding and returned demonstration  HOME EXERCISE PROGRAM: Access Code: [email or web address] URL: [email or web address] Date: 04/12/2024 Prepared by: Delon Pop  Exercises - Feet Together Balance at Counter Top Eyes Closed  - 1 x daily - 7 x weekly - 3 sets - 30s hold - Semi-Tandem Balance at Counter Top Eyes Closed  - 1 x daily - 7 x weekly - 3 sets - 30s hold - Heel Raises with Counter Support  - 1 x daily -  7 x weekly - 3 sets - 10 reps - Single Leg Balance with Opposite Leg Star Reach  - 1 x daily - 7 x weekly - 3 sets - 5 reps - Mini Squat with Counter Support  - 1 x daily - 7 x weekly - 3 sets - 10 reps - Tandem Walking with Counter Support  - 1 x daily - 7 x weekly - 3 sets - 10 reps - Backward Tandem Walking with Counter Support  - 1 x daily - 7 x weekly - 3 sets - 10 reps - Forward and Backward Monster Walk with Resistance at Ankles and Counter Support  - 1 x daily - 7 x weekly - 3 sets - 10 reps - Kettlebell Swing  - 1 x daily - 7 x weekly - 3 sets - 10  reps - Reverse Lunge  - 1 x daily - 7 x weekly - 3 sets - 10 reps - Curtsy lunge   - 1 x daily - 7 x weekly - 3 sets - 10 reps  GOALS: Goals reviewed with patient? Yes  SHORT TERM GOALS: Target date: 05/05/2024   Pt will be independent with initial HEP for improved strength, balance, transfers and gait.  Baseline: not established on eval  Goal status: MET  2.  Pt will improve 5 x STS to less than or equal to 15 seconds w/equal weight shift to demonstrate improved functional strength and transfer efficiency.   Baseline: 19.97s no UE support and shift to R; 9.16s no UE support (10/15)  Goal status: MET  3.  Pt will improve gait velocity to at least 2.5 ft/s w/LRAD and SBA for improved gait efficiency and independence   Baseline: 2.19 ft/s no AD and CGA; 4.12 ft/s no AD (10/15) Goal status: MET  4.  Pt will improve condition 2 of MCTSIB to 30s for improved somatosensory and vestibular input and safety at home  Baseline: 27.33s; 30s (10/15) Goal status: MET  LONG TERM GOALS: Target date: 05/19/2024   Pt will be independent with final HEP for improved strength, balance, transfers and gait.  Baseline:  Goal status: MET  2.  Pt will improve 5 x STS to less than or equal to 12 seconds to demonstrate improved functional strength and transfer efficiency.   Baseline: 19.97s w/no UE support and shift to R; 9.16s (10/15)  Goal status: MET  3.  Pt will improve gait velocity to at least 2.9 ft/s w/LRAD mod I for improved gait efficiency and reduced fall risk   Baseline: 2.19 ft/s w/no AD and CGA; 4.12 ft/s (10/15) Goal status: MET  4.  Pt will hold condition 4 of MCTSIB to >/= 10s for improved vestibular input on unlevel surfaces Baseline: 0s; 30s (10/15) Goal status: MET  ASSESSMENT:  CLINICAL IMPRESSION: Emphasis of skilled PT session on LTG assessment and DC from PT. Pt reports he has returned to working full time and is cleared to drive up to 15 minutes, so feels ready to  DC. Pt has met all his STGs and LTGs, holding conditions 2 and 4 on MCTSIB for 30s and demonstrating significant improvements in both gait speed and 5x STS. Pt is ambulating at mod I level without AD and able to perform all ADLS and IADLS independently. Pt in agreement to DC this date and verbalized understanding of how to return in future if mobility needs change.    OBJECTIVE IMPAIRMENTS: Abnormal gait, decreased activity tolerance, decreased balance, decreased coordination, decreased endurance, decreased knowledge  of condition, decreased mobility, difficulty walking, decreased strength, decreased safety awareness, impaired sensation, impaired UE functional use, and improper body mechanics  ACTIVITY LIMITATIONS: carrying, lifting, bending, standing, squatting, stairs, transfers, bed mobility, bathing, hygiene/grooming, locomotion level, and caring for others  PARTICIPATION LIMITATIONS: meal prep, cleaning, interpersonal relationship, driving, shopping, community activity, occupation, and yard work  PERSONAL FACTORS: 1 comorbidity: paresthesias are also affecting patient's functional outcome.   REHAB POTENTIAL: Good  CLINICAL DECISION MAKING: Evolving/moderate complexity  EVALUATION COMPLEXITY: Moderate  PLAN:  PT FREQUENCY: 2x/week  PT DURATION: 6 weeks  PLANNED INTERVENTIONS: 97164- PT Re-evaluation, 97750- Physical Performance Testing, 97110-Therapeutic exercises, 97530- Therapeutic activity, 97112- Neuromuscular re-education, 97535- Self Care, 02859- Manual therapy, [phone]- Gait training, [phone]- Orthotic Initial, [phone]- Orthotic/Prosthetic subsequent, [phone]- Aquatic Therapy, [phone]- Electrical stimulation (manual), [phone] (1-2 muscles), [phone] (3+ muscles)- Dry Needling, Patient/Family education, Balance training, Stair training, Joint mobilization, Spinal mobilization, Vestibular training, and DME instructions    Franca Stakes E Rease Wence, PT, DPT 04/28/2024, 8:17 AM

## 2024-05-03 ENCOUNTER — Ambulatory Visit

## 2024-05-10 ENCOUNTER — Ambulatory Visit: Admitting: Physical Therapy

## 2024-05-12 ENCOUNTER — Ambulatory Visit: Admitting: Physical Therapy

## 2024-05-17 ENCOUNTER — Ambulatory Visit

## 2024-05-19 ENCOUNTER — Ambulatory Visit: Admitting: Physical Therapy

## 2024-05-24 ENCOUNTER — Ambulatory Visit: Admitting: Physical Therapy

## 2024-06-14 ENCOUNTER — Ambulatory Visit (INDEPENDENT_AMBULATORY_CARE_PROVIDER_SITE_OTHER): Admitting: Neurology

## 2024-06-14 ENCOUNTER — Encounter: Payer: Self-pay | Admitting: Neurology

## 2024-06-14 VITALS — BP 119/81 | HR 74 | Resp 97 | Ht 70.0 in | Wt 228.0 lb

## 2024-06-14 DIAGNOSIS — E538 Deficiency of other specified B group vitamins: Secondary | ICD-10-CM | POA: Diagnosis not present

## 2024-06-14 DIAGNOSIS — G992 Myelopathy in diseases classified elsewhere: Secondary | ICD-10-CM | POA: Diagnosis not present
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
## 2024-12-13 ENCOUNTER — Ambulatory Visit: Admitting: Neurology
